# Patient Record
Sex: Female | Born: 1980 | Race: White | Hispanic: No | Marital: Married | Smoking: Current every day smoker
Health system: Southern US, Community
[De-identification: ages and names within clinical notes are randomized; demographics above are authoritative.]

## PROBLEM LIST (undated history)

## (undated) DIAGNOSIS — M545 Low back pain, unspecified: Secondary | ICD-10-CM

## (undated) DIAGNOSIS — G8929 Other chronic pain: Secondary | ICD-10-CM

## (undated) DIAGNOSIS — F419 Anxiety disorder, unspecified: Secondary | ICD-10-CM

## (undated) DIAGNOSIS — J302 Other seasonal allergic rhinitis: Secondary | ICD-10-CM

## (undated) DIAGNOSIS — K219 Gastro-esophageal reflux disease without esophagitis: Secondary | ICD-10-CM

## (undated) HISTORY — PX: NO PAST SURGERIES: SHX2092

## (undated) HISTORY — PX: TUBAL LIGATION: SHX77

---

## 2007-08-27 ENCOUNTER — Emergency Department (HOSPITAL_BASED_OUTPATIENT_CLINIC_OR_DEPARTMENT_OTHER): Admission: EM | Admit: 2007-08-27 | Discharge: 2007-08-27 | Payer: Self-pay | Admitting: Emergency Medicine

## 2008-01-08 ENCOUNTER — Emergency Department (HOSPITAL_BASED_OUTPATIENT_CLINIC_OR_DEPARTMENT_OTHER): Admission: EM | Admit: 2008-01-08 | Discharge: 2008-01-08 | Payer: Self-pay | Admitting: Emergency Medicine

## 2008-05-17 ENCOUNTER — Emergency Department (HOSPITAL_BASED_OUTPATIENT_CLINIC_OR_DEPARTMENT_OTHER): Admission: EM | Admit: 2008-05-17 | Discharge: 2008-05-17 | Payer: Self-pay | Admitting: Emergency Medicine

## 2008-06-09 ENCOUNTER — Emergency Department (HOSPITAL_BASED_OUTPATIENT_CLINIC_OR_DEPARTMENT_OTHER): Admission: EM | Admit: 2008-06-09 | Discharge: 2008-06-09 | Payer: Self-pay | Admitting: Emergency Medicine

## 2008-07-03 ENCOUNTER — Emergency Department (HOSPITAL_BASED_OUTPATIENT_CLINIC_OR_DEPARTMENT_OTHER): Admission: EM | Admit: 2008-07-03 | Discharge: 2008-07-03 | Payer: Self-pay | Admitting: Emergency Medicine

## 2008-07-31 ENCOUNTER — Emergency Department (HOSPITAL_BASED_OUTPATIENT_CLINIC_OR_DEPARTMENT_OTHER): Admission: EM | Admit: 2008-07-31 | Discharge: 2008-07-31 | Payer: Self-pay | Admitting: Emergency Medicine

## 2008-10-24 ENCOUNTER — Ambulatory Visit: Payer: Self-pay | Admitting: Interventional Radiology

## 2008-10-24 ENCOUNTER — Emergency Department (HOSPITAL_BASED_OUTPATIENT_CLINIC_OR_DEPARTMENT_OTHER): Admission: EM | Admit: 2008-10-24 | Discharge: 2008-10-24 | Payer: Self-pay | Admitting: Emergency Medicine

## 2008-12-25 ENCOUNTER — Emergency Department (HOSPITAL_BASED_OUTPATIENT_CLINIC_OR_DEPARTMENT_OTHER): Admission: EM | Admit: 2008-12-25 | Discharge: 2008-12-25 | Payer: Self-pay | Admitting: Emergency Medicine

## 2009-01-23 ENCOUNTER — Emergency Department (HOSPITAL_BASED_OUTPATIENT_CLINIC_OR_DEPARTMENT_OTHER): Admission: EM | Admit: 2009-01-23 | Discharge: 2009-01-23 | Payer: Self-pay | Admitting: Emergency Medicine

## 2009-01-25 ENCOUNTER — Emergency Department (HOSPITAL_BASED_OUTPATIENT_CLINIC_OR_DEPARTMENT_OTHER): Admission: EM | Admit: 2009-01-25 | Discharge: 2009-01-25 | Payer: Self-pay | Admitting: Emergency Medicine

## 2009-11-02 ENCOUNTER — Emergency Department (HOSPITAL_BASED_OUTPATIENT_CLINIC_OR_DEPARTMENT_OTHER): Admission: EM | Admit: 2009-11-02 | Discharge: 2009-11-02 | Payer: Self-pay | Admitting: Emergency Medicine

## 2010-02-22 ENCOUNTER — Emergency Department (HOSPITAL_BASED_OUTPATIENT_CLINIC_OR_DEPARTMENT_OTHER)
Admission: EM | Admit: 2010-02-22 | Discharge: 2010-02-22 | Payer: Self-pay | Source: Home / Self Care | Admitting: Emergency Medicine

## 2010-03-06 NOTE — L&D Delivery Note (Signed)
Pt presented to Labor and Delivery and was 8cm. She rapidly progressed to C/C/0. Second stage was brief. She had SROM with clear fluid. She delivered one live viable white female over a second degree midline tear in ROA position.  Placenta S/I 3vc. EBL-400cc Baby to NBN. No c/o. Tear closed with 3-0 chromic.

## 2010-05-01 IMAGING — CR DG LUMBAR SPINE COMPLETE 4+V
5 series · 5 of 5 positions shown · non-contrast
Comparison: None

CLINICAL DATA: lower back pain x 3 wks, no known injury

LUMBAR SPINE - COMPLETE 4+ VIEW

[t l-spine a.p.]
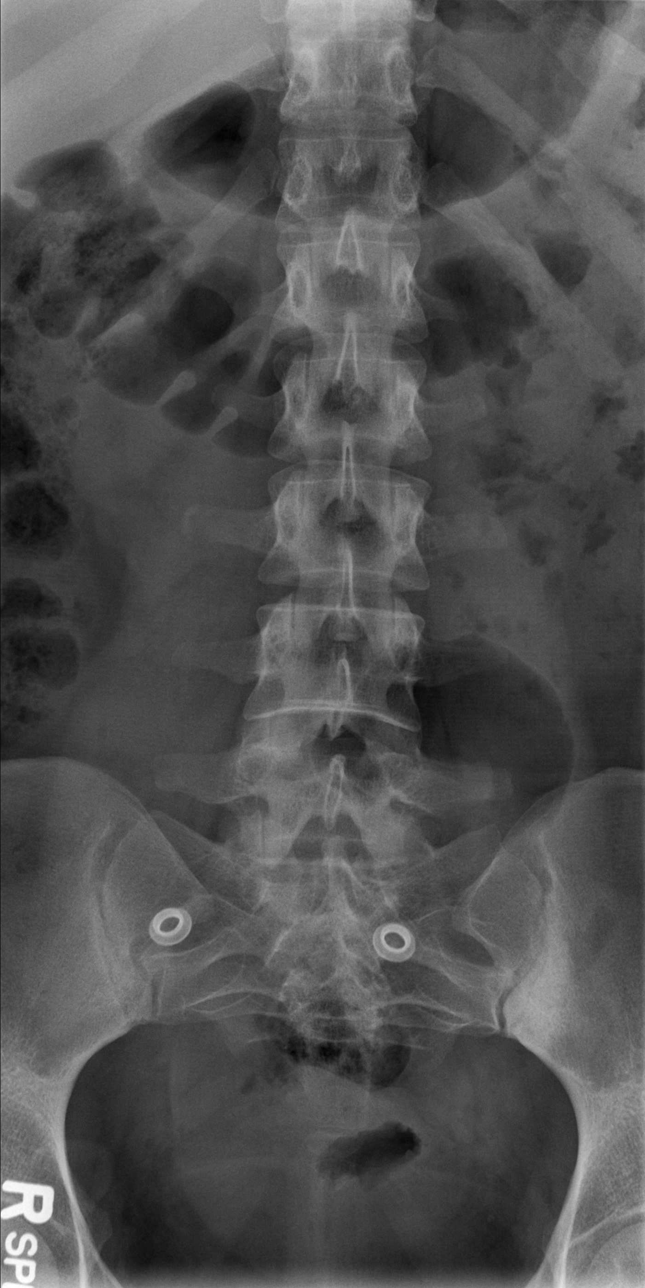

[t l-spine oblique exposure (1 of 2)]
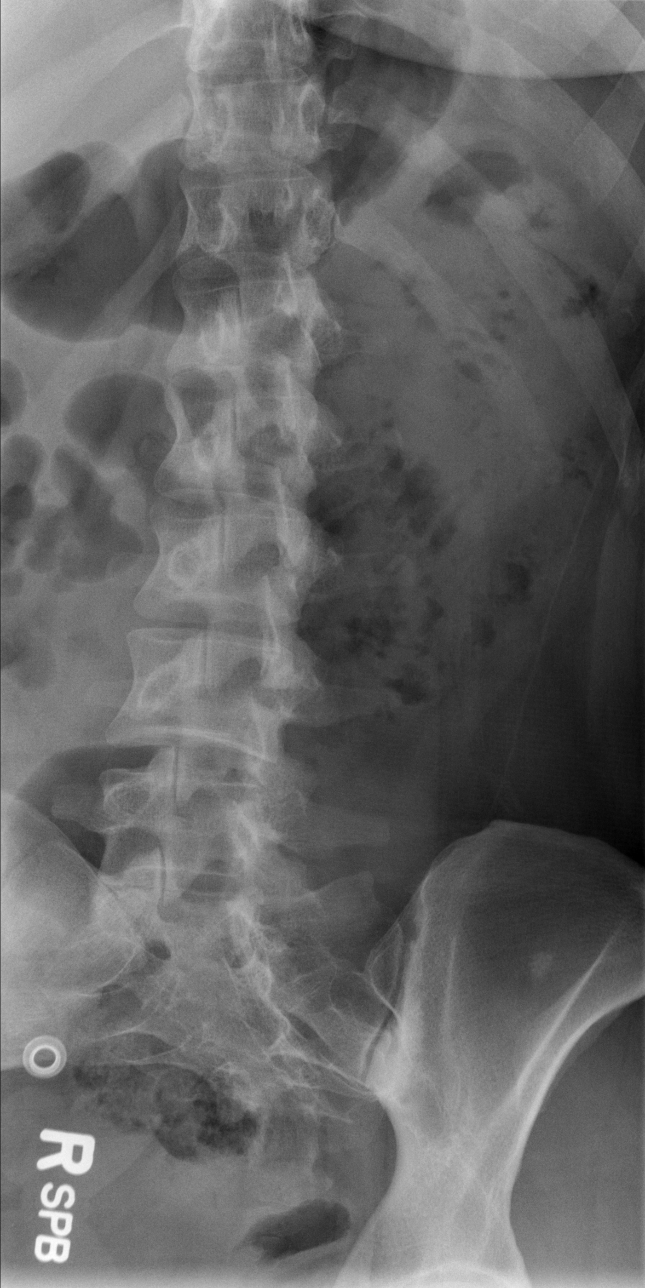

[t l-spine oblique exposure (2 of 2)]
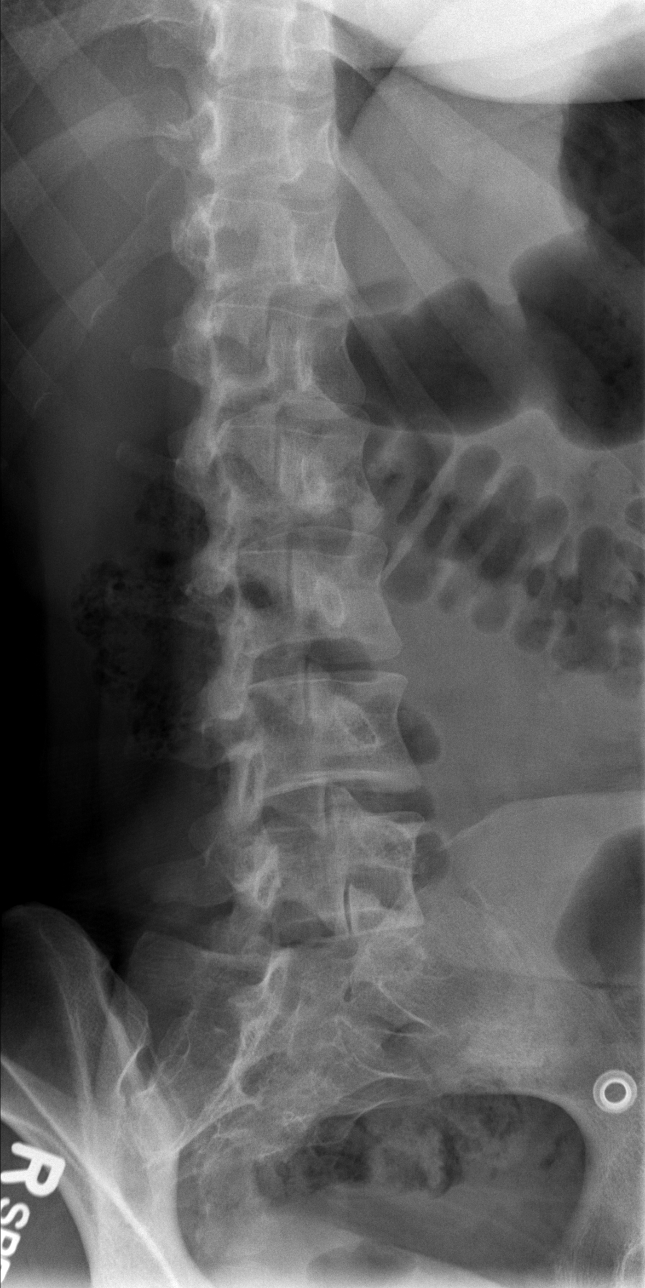

[t l-spine lat]
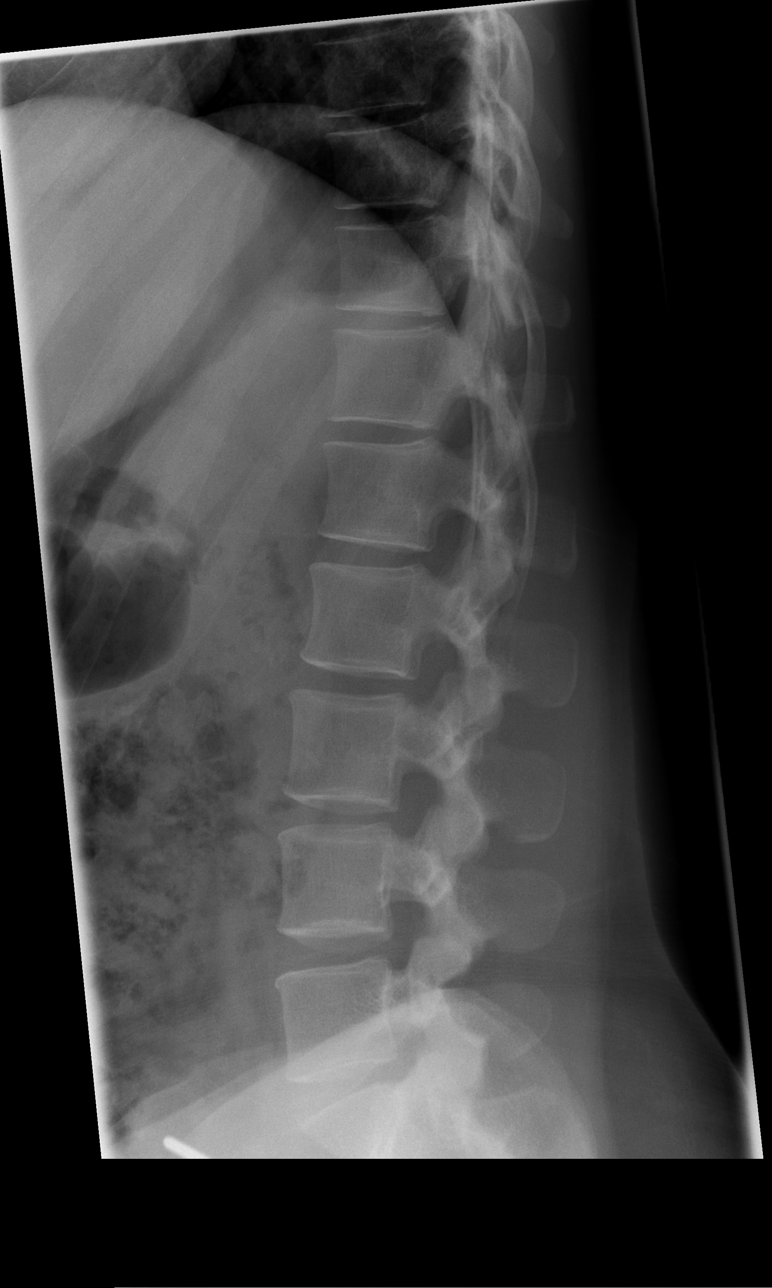

[t l-spine l5-s1 spot]
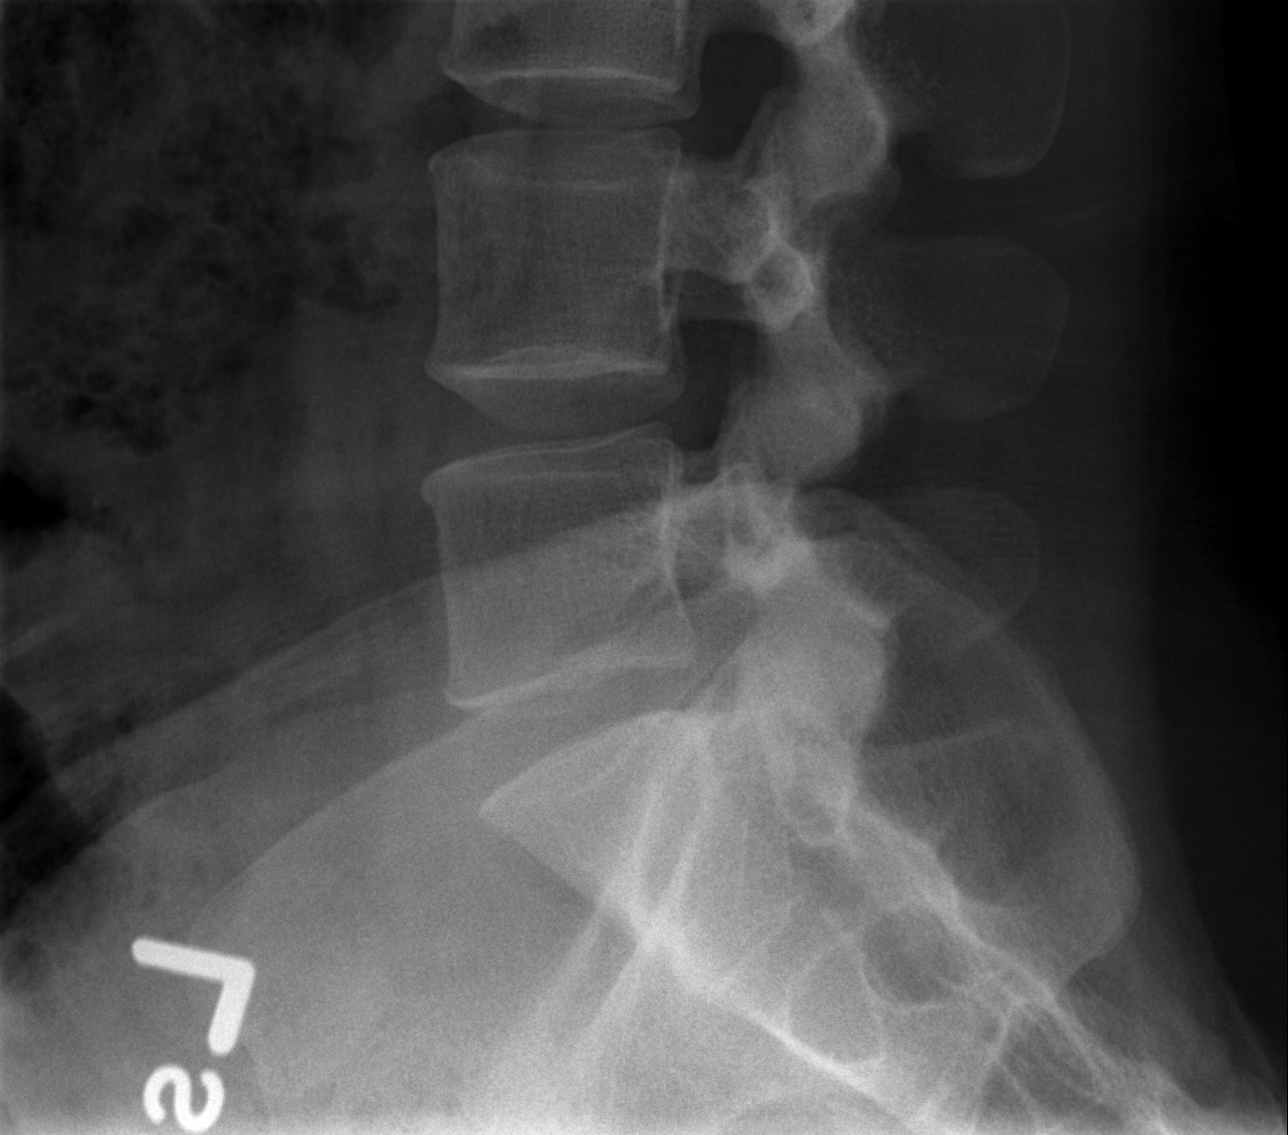

[5 of 5 positions shown; findings below may reference images not displayed]

FINDINGS: There is no evidence of lumbar spine fracture.  Alignment
is normal.  Intervertebral disc spaces are maintained.
IMPRESSION: Negative.

## 2010-05-16 LAB — WET PREP, GENITAL: Clue Cells Wet Prep HPF POC: NONE SEEN

## 2010-05-16 LAB — CBC
HCT: 40.7 % (ref 36.0–46.0)
Hemoglobin: 14.1 g/dL (ref 12.0–15.0)
MCH: 29.3 pg (ref 26.0–34.0)
MCHC: 34.6 g/dL (ref 30.0–36.0)
MCV: 84.6 fL (ref 78.0–100.0)

## 2010-05-16 LAB — DIFFERENTIAL
Basophils Relative: 1 % (ref 0–1)
Eosinophils Absolute: 0.2 10*3/uL (ref 0.0–0.7)
Monocytes Absolute: 0.6 10*3/uL (ref 0.1–1.0)
Monocytes Relative: 7 % (ref 3–12)

## 2010-05-16 LAB — URINALYSIS, ROUTINE W REFLEX MICROSCOPIC
Bilirubin Urine: NEGATIVE
Glucose, UA: NEGATIVE mg/dL
Ketones, ur: NEGATIVE mg/dL
pH: 5.5 (ref 5.0–8.0)

## 2010-05-16 LAB — GC/CHLAMYDIA PROBE AMP, GENITAL: Chlamydia, DNA Probe: NEGATIVE

## 2010-06-11 LAB — URINALYSIS, ROUTINE W REFLEX MICROSCOPIC
Bilirubin Urine: NEGATIVE
Nitrite: NEGATIVE
Specific Gravity, Urine: 1.018 (ref 1.005–1.030)
Urobilinogen, UA: 0.2 mg/dL (ref 0.0–1.0)

## 2010-06-11 LAB — PREGNANCY, URINE: Preg Test, Ur: NEGATIVE

## 2010-06-13 ENCOUNTER — Emergency Department (HOSPITAL_BASED_OUTPATIENT_CLINIC_OR_DEPARTMENT_OTHER)
Admission: EM | Admit: 2010-06-13 | Discharge: 2010-06-13 | Disposition: A | Discharge status: Home / Self Care | Payer: Self-pay | Attending: Emergency Medicine | Admitting: Emergency Medicine

## 2010-06-13 ENCOUNTER — Inpatient Hospital Stay (HOSPITAL_COMMUNITY)
Admission: AD | Admit: 2010-06-13 | Discharge: 2010-06-14 | Disposition: A | Discharge status: Home / Self Care | Payer: Self-pay | Source: Ambulatory Visit | Attending: Family Medicine | Admitting: Family Medicine

## 2010-06-13 DIAGNOSIS — R109 Unspecified abdominal pain: Secondary | ICD-10-CM | POA: Insufficient documentation

## 2010-06-13 DIAGNOSIS — O2690 Pregnancy related conditions, unspecified, unspecified trimester: Secondary | ICD-10-CM

## 2010-06-13 DIAGNOSIS — O99891 Other specified diseases and conditions complicating pregnancy: Secondary | ICD-10-CM | POA: Insufficient documentation

## 2010-06-13 LAB — URINALYSIS, ROUTINE W REFLEX MICROSCOPIC
Bilirubin Urine: NEGATIVE
Glucose, UA: NEGATIVE mg/dL
Hgb urine dipstick: NEGATIVE
Ketones, ur: NEGATIVE mg/dL
Protein, ur: NEGATIVE mg/dL

## 2010-06-13 LAB — WET PREP, GENITAL
Trich, Wet Prep: NONE SEEN
Yeast Wet Prep HPF POC: NONE SEEN

## 2010-06-13 LAB — URINE MICROSCOPIC-ADD ON

## 2010-06-14 ENCOUNTER — Inpatient Hospital Stay (HOSPITAL_COMMUNITY): Payer: Self-pay

## 2010-06-15 LAB — URINALYSIS, ROUTINE W REFLEX MICROSCOPIC
Bilirubin Urine: NEGATIVE
Glucose, UA: NEGATIVE mg/dL
Specific Gravity, Urine: 1.022 (ref 1.005–1.030)
pH: 7 (ref 5.0–8.0)

## 2010-06-15 LAB — URINE MICROSCOPIC-ADD ON

## 2010-06-15 LAB — URINE CULTURE

## 2010-06-16 ENCOUNTER — Inpatient Hospital Stay (HOSPITAL_COMMUNITY)
Admission: AD | Admit: 2010-06-16 | Discharge: 2010-06-17 | Disposition: A | Discharge status: Home / Self Care | Payer: Self-pay | Source: Ambulatory Visit | Attending: Obstetrics & Gynecology | Admitting: Obstetrics & Gynecology

## 2010-06-16 DIAGNOSIS — O99891 Other specified diseases and conditions complicating pregnancy: Secondary | ICD-10-CM | POA: Insufficient documentation

## 2010-06-16 DIAGNOSIS — O9989 Other specified diseases and conditions complicating pregnancy, childbirth and the puerperium: Secondary | ICD-10-CM

## 2010-06-16 LAB — URINALYSIS, ROUTINE W REFLEX MICROSCOPIC
Glucose, UA: NEGATIVE mg/dL
Ketones, ur: 15 mg/dL — AB
Nitrite: POSITIVE — AB
Protein, ur: 30 mg/dL — AB
Specific Gravity, Urine: 1.025 (ref 1.005–1.030)
Urobilinogen, UA: 1 mg/dL (ref 0.0–1.0)
pH: 5.5 (ref 5.0–8.0)

## 2010-06-16 LAB — URINE MICROSCOPIC-ADD ON

## 2010-06-16 LAB — URINE CULTURE: Colony Count: >=100000

## 2010-06-17 ENCOUNTER — Other Ambulatory Visit (HOSPITAL_COMMUNITY): Payer: Self-pay

## 2010-06-17 DIAGNOSIS — O26849 Uterine size-date discrepancy, unspecified trimester: Secondary | ICD-10-CM

## 2010-06-23 ENCOUNTER — Ambulatory Visit (HOSPITAL_COMMUNITY)
Admission: RE | Admit: 2010-06-23 | Discharge: 2010-06-23 | Disposition: A | Discharge status: Home / Self Care | Payer: Self-pay | Source: Ambulatory Visit

## 2010-06-23 ENCOUNTER — Encounter (HOSPITAL_COMMUNITY): Payer: Self-pay

## 2010-06-23 ENCOUNTER — Inpatient Hospital Stay (HOSPITAL_COMMUNITY)
Admission: AD | Admit: 2010-06-23 | Discharge: 2010-06-23 | Disposition: A | Discharge status: Home / Self Care | Payer: Self-pay | Source: Ambulatory Visit | Attending: Obstetrics & Gynecology | Admitting: Obstetrics & Gynecology

## 2010-06-23 DIAGNOSIS — O26849 Uterine size-date discrepancy, unspecified trimester: Secondary | ICD-10-CM

## 2010-06-23 DIAGNOSIS — Z3689 Encounter for other specified antenatal screening: Secondary | ICD-10-CM | POA: Insufficient documentation

## 2010-06-23 DIAGNOSIS — O9989 Other specified diseases and conditions complicating pregnancy, childbirth and the puerperium: Secondary | ICD-10-CM

## 2010-06-23 DIAGNOSIS — O99891 Other specified diseases and conditions complicating pregnancy: Secondary | ICD-10-CM | POA: Insufficient documentation

## 2010-07-25 LAB — HIV ANTIBODY (ROUTINE TESTING W REFLEX): HIV: NONREACTIVE

## 2010-07-25 LAB — ABO/RH

## 2010-08-06 ENCOUNTER — Emergency Department (HOSPITAL_BASED_OUTPATIENT_CLINIC_OR_DEPARTMENT_OTHER)
Admission: EM | Admit: 2010-08-06 | Discharge: 2010-08-06 | Disposition: A | Discharge status: Home / Self Care | Payer: Medicaid Other | Attending: Emergency Medicine | Admitting: Emergency Medicine

## 2010-08-06 DIAGNOSIS — K089 Disorder of teeth and supporting structures, unspecified: Secondary | ICD-10-CM | POA: Insufficient documentation

## 2010-09-21 ENCOUNTER — Inpatient Hospital Stay (HOSPITAL_COMMUNITY): Payer: Medicaid Other

## 2010-09-21 ENCOUNTER — Inpatient Hospital Stay (HOSPITAL_COMMUNITY)
Admission: AD | Admit: 2010-09-21 | Discharge: 2010-09-21 | Disposition: A | Discharge status: Home / Self Care | Payer: Medicaid Other | Source: Ambulatory Visit | Attending: Obstetrics and Gynecology | Admitting: Obstetrics and Gynecology

## 2010-09-21 ENCOUNTER — Encounter (HOSPITAL_COMMUNITY): Payer: Self-pay

## 2010-09-21 DIAGNOSIS — O99891 Other specified diseases and conditions complicating pregnancy: Secondary | ICD-10-CM | POA: Insufficient documentation

## 2010-09-21 DIAGNOSIS — O26899 Other specified pregnancy related conditions, unspecified trimester: Secondary | ICD-10-CM

## 2010-09-21 DIAGNOSIS — R109 Unspecified abdominal pain: Secondary | ICD-10-CM

## 2010-09-21 DIAGNOSIS — O9989 Other specified diseases and conditions complicating pregnancy, childbirth and the puerperium: Secondary | ICD-10-CM

## 2010-09-21 LAB — URINALYSIS, ROUTINE W REFLEX MICROSCOPIC
Bilirubin Urine: NEGATIVE
Glucose, UA: NEGATIVE mg/dL
Hgb urine dipstick: NEGATIVE
Protein, ur: NEGATIVE mg/dL

## 2010-09-21 LAB — COMPREHENSIVE METABOLIC PANEL
ALT: 14 U/L (ref 0–35)
AST: 14 U/L (ref 0–37)
Albumin: 3 g/dL — ABNORMAL LOW (ref 3.5–5.2)
Alkaline Phosphatase: 61 U/L (ref 39–117)
Chloride: 103 mEq/L (ref 96–112)
Potassium: 3.4 mEq/L — ABNORMAL LOW (ref 3.5–5.1)
Total Bilirubin: 0.2 mg/dL — ABNORMAL LOW (ref 0.3–1.2)

## 2010-09-21 LAB — CBC
HCT: 34.1 % — ABNORMAL LOW (ref 36.0–46.0)
Platelets: 218 10*3/uL (ref 150–400)
RDW: 13.4 % (ref 11.5–15.5)
WBC: 12.5 10*3/uL — ABNORMAL HIGH (ref 4.0–10.5)

## 2010-09-21 LAB — URINE MICROSCOPIC-ADD ON

## 2010-09-21 MED ORDER — ONDANSETRON 8 MG PO TBDP: 8.0000 mg | ORAL_TABLET | Freq: Three times a day (TID) | ORAL | Status: AC | PRN

## 2010-09-21 MED ORDER — ONDANSETRON 8 MG PO TBDP
8.0000 mg | ORAL_TABLET | Freq: Once | ORAL | Status: AC
  Administered 2010-09-21: 8 mg via ORAL
  Filled 2010-09-21: qty 1

## 2010-09-21 NOTE — ED Provider Notes (Addendum)
History     Chief Complaint  Patient presents with  . Abdominal Cramping   HPIpt is [redacted] weeks pregnant and presents with abdominal cramping.  Her pain is mostly fundal, but also bilateral.  She also has had nausea.  She denies constipation, diarrhea or UTI symptoms, vaginal spotting or bleeding.  She is being seen at [facility].  Her appointment was rescheduled by the clinic for another few weeks.  Pt has a history of gestational diabetes with her previous pregnancy.       Past Medical History  Diagnosis Date  . Gestational diabetes     G2=A!DM    History reviewed. No pertinent past surgical history.  Family History  Problem Relation Age of Onset  . Asthma Father   . Asthma Daughter   . Cancer Maternal Grandmother     lung  . Diabetes Paternal Grandmother     History  Substance Use Topics  . Smoking status: Current Everyday Smoker -- 0.5 packs/day for 10 years    Types: Cigarettes  . Smokeless tobacco: Not on file  . Alcohol Use: No    Allergies:  Allergies  Allergen Reactions  . Aspirin Other (See Comments)    REACTION UNKNOWN    Prescriptions prior to admission  Medication Sig Dispense Refill  . acetaminophen (TYLENOL) 500 MG tablet Take 1,000 mg by mouth every 6 (six) hours as needed. PATIENT TAKES FOR PAIN         ROS Physical Exam   Blood pressure 123/67, pulse 96, temperature 98.7 F (37.1 C), temperature source Oral, resp. rate 16, height 5\' 4"  (1.626 m), weight 158 lb 9.6 oz (71.94 kg), last menstrual period 05/10/2010, SpO2 97.00%.  Physical Exam  Constitutional: She appears well-developed and well-nourished.  HENT:  Head: Normocephalic.  Eyes: Pupils are equal, round, and reactive to light.  Neck: Neck supple.  GI: Soft. Bowel sounds are normal. She exhibits no distension. There is no rebound and no guarding.  Musculoskeletal: Normal range of motion.  Neurological: She is alert.  Skin: Skin is warm and dry.    MAU Course  Procedures   Pt's  abdomen is soft- mildly diffusely tender; at this point her pain is better.  Since pt has not had an ultrasound since 6 weeks and is due to an anatomy scan, which pt and husband say has been delayed by the clinic, will proceed with the ultrasound. U/s showed single living IUP.  [place] EGA is concordant with LMP.  No fetal anomaklies seen involving visualized anatomy. Pt felt much better after Zofran. Explained to pt that baby looked good and not reasonable explanation for pain.  Pt would need to be careful about her sugar intake with her history of GD. Pt to f/u with [facility]  Assessment and Plan  Abd pain in pregnancy Round ligament pain  Jermesha Sottile 09/21/2010, 1:55 PM

## 2010-09-21 NOTE — Progress Notes (Signed)
Lower abd cramping x1-2 weeks. Denies LOF or vaginal bleeding.

## 2010-09-22 LAB — GC/CHLAMYDIA PROBE AMP, GENITAL
Chlamydia, DNA Probe: NEGATIVE
GC Probe Amp, Genital: NEGATIVE

## 2010-10-03 ENCOUNTER — Inpatient Hospital Stay (HOSPITAL_COMMUNITY)
Admission: AD | Admit: 2010-10-03 | Discharge: 2010-10-03 | Disposition: A | Discharge status: Home / Self Care | Payer: Medicaid Other | Source: Ambulatory Visit | Attending: Obstetrics & Gynecology | Admitting: Obstetrics & Gynecology

## 2010-10-03 DIAGNOSIS — O9989 Other specified diseases and conditions complicating pregnancy, childbirth and the puerperium: Secondary | ICD-10-CM | POA: Insufficient documentation

## 2010-10-03 DIAGNOSIS — N949 Unspecified condition associated with female genital organs and menstrual cycle: Secondary | ICD-10-CM | POA: Insufficient documentation

## 2010-10-03 DIAGNOSIS — T148XXA Other injury of unspecified body region, initial encounter: Secondary | ICD-10-CM

## 2010-10-03 DIAGNOSIS — M549 Dorsalgia, unspecified: Secondary | ICD-10-CM | POA: Insufficient documentation

## 2010-10-03 DIAGNOSIS — X503XXA Overexertion from repetitive movements, initial encounter: Secondary | ICD-10-CM | POA: Insufficient documentation

## 2010-10-03 DIAGNOSIS — O99330 Smoking (tobacco) complicating pregnancy, unspecified trimester: Secondary | ICD-10-CM | POA: Insufficient documentation

## 2010-10-03 LAB — URINALYSIS, ROUTINE W REFLEX MICROSCOPIC
Bilirubin Urine: NEGATIVE
Nitrite: NEGATIVE
Specific Gravity, Urine: 1.02 (ref 1.005–1.030)
Urobilinogen, UA: 0.2 mg/dL (ref 0.0–1.0)

## 2010-10-03 MED ORDER — CYCLOBENZAPRINE HCL 10 MG PO TABS
10.0000 mg | ORAL_TABLET | Freq: Once | ORAL | Status: AC
  Administered 2010-10-03: 10 mg via ORAL
  Filled 2010-10-03: qty 1

## 2010-10-03 NOTE — Progress Notes (Signed)
DENIES MRSA.  HAS UPPER AND LOWER ABD  SOMETIMES SINCE 0300- BUT NOW BACK HURTS.

## 2010-10-03 NOTE — Progress Notes (Signed)
GAVE ICE CHIPS  

## 2010-10-03 NOTE — Progress Notes (Signed)
States she had yellowish urine yesterday, now is having back pain "killing her".  20wks, g3p2

## 2010-10-03 NOTE — ED Provider Notes (Signed)
Chief Complaint:  Back Pain   Krystal Cardenas is  30 y.o. [id number]. [redacted]w[redacted]d  Patient's last menstrual period was 05/10/2010.Marland Kitchen  Her pregnancy status is positive.  She presents complaining of Back Pain Report BL lower abd pain, described as sharp in groin area and worse with movement. Low back pain. Reports recent heavy lifting at work on Saturday. Onset is described as sudden at 0200 this morning. Prenatal care at [facility].    Obstetrical/Gynecological History: OB History    Grav Para Term Preterm Abortions TAB SAB Ect Mult Living   3 2 2  0 0 0 0 0 0 2      Past Medical History: Past Medical History  Diagnosis Date  . Gestational diabetes     G2=A!DM    Past Surgical History: No past surgical history on file.  Family History: Family History  Problem Relation Age of Onset  . Asthma Father   . Asthma Daughter   . Cancer Maternal Grandmother     lung  . Diabetes Paternal Grandmother     Social History: History  Substance Use Topics  . Smoking status: Current Everyday Smoker -- 0.5 packs/day for 10 years    Types: Cigarettes  . Smokeless tobacco: Not on file  . Alcohol Use: No    Allergies:  Allergies  Allergen Reactions  . Aspirin Other (See Comments)    REACTION UNKNOWN  . Penicillins     unknown    Prescriptions prior to admission  Medication Sig Dispense Refill  . acetaminophen (TYLENOL) 500 MG tablet Take 1,000 mg by mouth every 6 (six) hours as needed. PATIENT TAKES FOR PAIN       . ondansetron (ZOFRAN-ODT) 4 MG disintegrating tablet Take 4 mg by mouth every 8 (eight) hours as needed. nausea       . prenatal vitamin w/FE, FA (PRENATAL 1 + 1) 27-1 MG TABS Take 1 tablet by mouth daily.          Review of Systems - Negative except what has been reviewed in the HPI  Physical Exam   Blood pressure 115/63, pulse 81, temperature 98.5 F (36.9 C), temperature source Oral, resp. rate 20, height 5\' 4"  (1.626 m), weight 73.12 kg (161 lb 3.2 oz), last menstrual period  05/10/2010.  General: General appearance - alert, well appearing, and in no distress and oriented to person, place, and time Mental status - normal mood, behavior, speech, dress, motor activity, and thought processes Abdomen - soft, nontender, nondistended, no masses or organomegaly, Gravid Back exam - normal reflexes and strength bilateral lower extremities, palp spasm lumbar paraspinal muscles  Neurological - alert, oriented, normal speech, no focal findings or movement disorder noted Musculoskeletal - full range of motion without pain Focused Gynecological Exam: long/thick/close  Labs: Recent Results (from the past 24 hour(s))  URINALYSIS, ROUTINE W REFLEX MICROSCOPIC   Collection Time   10/03/10  6:40 AM      Component Value Range   Color, Urine YELLOW  YELLOW    Appearance CLEAR  CLEAR    Specific Gravity, Urine 1.020  1.005 - 1.030    pH 6.5  5.0 - 8.0    Glucose, UA NEGATIVE  NEGATIVE (mg/dL)   Hgb urine dipstick NEGATIVE  NEGATIVE    Bilirubin Urine NEGATIVE  NEGATIVE    Ketones, ur NEGATIVE  NEGATIVE (mg/dL)   Protein, ur NEGATIVE  NEGATIVE (mg/dL)   Urobilinogen, UA 0.2  0.0 - 1.0 (mg/dL)   Nitrite NEGATIVE  NEGATIVE  Leukocytes, UA NEGATIVE  NEGATIVE    I Assessment: Round Ligament Pain  Muscle Strain  Plan: Discharge Home Flexeril prn FU as scheduled for routing [facility] at [facility] E. 10/03/2010,7:13 AM

## 2010-12-01 LAB — RAPID STREP SCREEN (MED CTR MEBANE ONLY): Streptococcus, Group A Screen (Direct): POSITIVE — AB

## 2010-12-06 LAB — URINALYSIS, ROUTINE W REFLEX MICROSCOPIC
Bilirubin Urine: NEGATIVE
Ketones, ur: 15 — AB
Nitrite: NEGATIVE
pH: 7

## 2010-12-06 LAB — PREGNANCY, URINE: Preg Test, Ur: NEGATIVE

## 2010-12-06 LAB — URINE MICROSCOPIC-ADD ON

## 2011-01-05 NOTE — ED Provider Notes (Signed)
Agree with above note.  Analucia Hush 01/05/2011 12:36 PM   

## 2011-01-06 ENCOUNTER — Inpatient Hospital Stay (HOSPITAL_COMMUNITY)
Admission: AD | Admit: 2011-01-06 | Discharge: 2011-01-07 | Disposition: A | Discharge status: Home / Self Care | Payer: Medicaid Other | Source: Ambulatory Visit | Attending: Obstetrics and Gynecology | Admitting: Obstetrics and Gynecology

## 2011-01-06 ENCOUNTER — Encounter (HOSPITAL_COMMUNITY): Payer: Self-pay

## 2011-01-06 DIAGNOSIS — O479 False labor, unspecified: Secondary | ICD-10-CM

## 2011-01-06 DIAGNOSIS — O99891 Other specified diseases and conditions complicating pregnancy: Secondary | ICD-10-CM | POA: Insufficient documentation

## 2011-01-06 NOTE — Progress Notes (Signed)
Pitcher of water to pt 

## 2011-01-06 NOTE — Progress Notes (Signed)
Pt states, " I went to the bathroom to and felt something thicker than urine come out. It was white in the bottom of the tolilte. I got up and layed back down and noticed that my underware got wet. I went back to the bathroom and discovered a big glob of mucus, like a mucus plug. I 've had contractions off and on since yesterday."

## 2011-01-07 LAB — POCT FERN TEST: Fern Test: NEGATIVE

## 2011-01-07 LAB — AMNISURE RUPTURE OF MEMBRANE (ROM) NOT AT ARMC: Amnisure ROM: NEGATIVE

## 2011-01-07 NOTE — Progress Notes (Signed)
Written and verbal d/c instructions given and understanding voiced. 

## 2011-01-07 NOTE — Progress Notes (Signed)
AmniSure done and sent. Fern slide also obtained. Pt tol spec exam and sve

## 2011-01-07 NOTE — ED Provider Notes (Signed)
Krystal Tinsbloom30 y.A.[id number] @[redacted]w[redacted]d  Chief Complaint  Patient presents with  . Rupture of Membranes    SUBJECTIVE  HPI: At 2130 on 01/06/11, felt something thicker than urine come out while urinating and it looked white in toilet bowl. After laying down she had wet underwear wet and passed mucusy white discharge with streak of pink blood. Not leaking since. Has felt occ mild UC. No vaginal bleeding. Good fetal movement.    Past Medical History  Diagnosis Date  . Gestational diabetes     G2=A!DM   Past Surgical History  Procedure Date  . No past surgeries     No current facility-administered medications on file prior to encounter.   Current Outpatient Prescriptions on File Prior to Encounter  Medication Sig Dispense Refill  . prenatal vitamin w/FE, FA (PRENATAL 1 + 1) 27-1 MG TABS Take 1 tablet by mouth daily.         Allergies  Allergen Reactions  . Aspirin Other (See Comments)    Childhood allergy; Reaction unknown  . Penicillins Itching and Other (See Comments)    Secondary reaction unknown    ROS: Pertinent items in HPI  OBJECTIVE  BP 117/77  Pulse 85  Temp(Src) 98.9 F (37.2 C) (Oral)  Resp 18  Ht 5\' 4"  (1.626 m)  Wt 75.921 kg (167 lb 6 oz)  BMI 28.73 kg/m2  LMP 05/10/2010   Physical Exam  Constitutional: She is oriented to person, place, and time and well-developed, well-nourished, and in no distress. No distress.  HENT:  Head: Normocephalic.  Eyes: Pupils are equal, round, and reactive to light.  Neck: Neck supple.  Abdominal: Soft. There is no tenderness.       S=D  Genitourinary: Uterus normal. Vaginal discharge found.       SSE: white discharge, no blood. Neg pool. Neg fern VE: post soft/ext 1/int closed/ high  Neurological: She is alert and oriented to person, place, and time.  Skin: Skin is warm and dry.  Psychiatric: Affect normal.   FHR:130 baseline reactive, mod variability Toco: slight UI  Results for orders placed during the hospital  encounter of 01/06/11 (from the past 24 hour(s))  AMNISURE RUPTURE OF MEMBRANE (ROM)     Status: Normal   Collection Time   01/07/11 12:20 AM      Component Value Range   Amnisure ROM NEGATIVE    POCT FERN TEST     Status: Normal   Collection Time   01/07/11 12:27 AM      Component Value Range   Fern Test Negative     ASSESSMENT   G3P2002 at 34 wks with Cat 1 FHR tracing No evidence SROM  PLAN  D/W Dr. Henderson Cloud. Discharge home with labor precautions

## 2011-01-07 NOTE — Progress Notes (Signed)
Ok to d/c efm per D. Poe CNM 

## 2011-01-15 ENCOUNTER — Inpatient Hospital Stay (HOSPITAL_COMMUNITY)
Admission: AD | Admit: 2011-01-15 | Discharge: 2011-01-16 | Disposition: A | Discharge status: Home / Self Care | Payer: Medicaid Other | Source: Ambulatory Visit | Attending: Obstetrics and Gynecology | Admitting: Obstetrics and Gynecology

## 2011-01-15 DIAGNOSIS — O4700 False labor before 37 completed weeks of gestation, unspecified trimester: Secondary | ICD-10-CM | POA: Insufficient documentation

## 2011-01-15 LAB — URINALYSIS, ROUTINE W REFLEX MICROSCOPIC
Ketones, ur: NEGATIVE mg/dL
Nitrite: NEGATIVE
Protein, ur: NEGATIVE mg/dL
Urobilinogen, UA: 0.2 mg/dL (ref 0.0–1.0)
pH: 6 (ref 5.0–8.0)

## 2011-01-15 LAB — URINE MICROSCOPIC-ADD ON

## 2011-01-15 NOTE — ED Provider Notes (Signed)
Chief Complaint:  Contractions   Krystal Cardenas is  30 y.o. [id number].  Patient's last menstrual period was 05/10/2010..  [redacted]w[redacted]d She presents complaining of Contractions . Onset is described as gradual and has been present for  2 hours. Denies blding, LOF, Reports + FM  Obstetrical/Gynecological History: OB History    Grav Para Term Preterm Abortions TAB SAB Ect Mult Living   3 2 2  0 0 0 0 0 0 2      Past Medical History: Past Medical History  Diagnosis Date  . Gestational diabetes     G2=A!DM    Past Surgical History: Past Surgical History  Procedure Date  . No past surgeries     Family History: Family History  Problem Relation Age of Onset  . Asthma Father   . Asthma Daughter   . Cancer Maternal Grandmother     lung  . Diabetes Paternal Grandmother     Social History: History  Substance Use Topics  . Smoking status: Current Everyday Smoker -- 0.5 packs/day for 10 years    Types: Cigarettes  . Smokeless tobacco: Not on file  . Alcohol Use: No    Allergies:  Allergies  Allergen Reactions  . Aspirin Other (See Comments)    Childhood allergy; Reaction unknown  . Penicillins Itching and Other (See Comments)    Secondary reaction unknown    Prescriptions prior to admission  Medication Sig Dispense Refill  . diphenhydramine-acetaminophen (TYLENOL PM) 25-500 MG TABS Take 1 tablet by mouth at bedtime as needed. For pain       . OVER THE COUNTER MEDICATION Take 1 tablet by mouth daily as needed. [employer] brand acid reducer for heartburn       . prenatal vitamin w/FE, FA (PRENATAL 1 + 1) 27-1 MG TABS Take 1 tablet by mouth daily.          Review of Systems - Negative except what has been reviewed in the HPI  Physical Exam   Blood pressure 127/80, pulse 84, temperature 98 F (36.7 C), temperature source Oral, resp. rate 20, last menstrual period 05/10/2010.  General: General appearance - alert, well appearing, and in no distress and oriented to person,  place, and time Mental status - alert, oriented to person, place, and time, normal mood, behavior, speech, dress, motor activity, and thought processes, affect appropriate to mood Abdomen - Gravid, nontender Focused Gynecological Exam: CERVIX: FT/Thick/High FHT: 120, category 1 tracing, irreg ctx, mild to palp  Labs: Recent Results (from the past 24 hour(s))  URINALYSIS, ROUTINE W REFLEX MICROSCOPIC   Collection Time   01/15/11 11:20 PM      Component Value Range   Color, Urine YELLOW  YELLOW    Appearance CLEAR  CLEAR    Specific Gravity, Urine 1.015  1.005 - 1.030    pH 6.0  5.0 - 8.0    Glucose, UA NEGATIVE  NEGATIVE (mg/dL)   Hgb urine dipstick NEGATIVE  NEGATIVE    Bilirubin Urine NEGATIVE  NEGATIVE    Ketones, ur NEGATIVE  NEGATIVE (mg/dL)   Protein, ur NEGATIVE  NEGATIVE (mg/dL)   Urobilinogen, UA 0.2  0.0 - 1.0 (mg/dL)   Nitrite NEGATIVE  NEGATIVE    Leukocytes, UA SMALL (*) NEGATIVE   URINE MICROSCOPIC-ADD ON   Collection Time   01/15/11 11:20 PM      Component Value Range   Squamous Epithelial / LPF MANY (*) RARE    WBC, UA 7-10  <3 (WBC/hpf)   Bacteria, UA RARE  RARE    Urine-Other MUCOUS PRESENT     MD Consult: Discussed with Dr. Claiborne Billings   Assessment: False Labor Fetal Testing c/w well-being  Plan: Discharge home FU in office tomorrow for routine visit  Fareed Fung E. 01/15/2011,11:53 PM

## 2011-01-16 ENCOUNTER — Inpatient Hospital Stay (HOSPITAL_COMMUNITY)
Admission: AD | Admit: 2011-01-16 | Discharge: 2011-01-16 | Disposition: A | Discharge status: Home / Self Care | Payer: Medicaid Other | Source: Ambulatory Visit | Attending: Obstetrics and Gynecology | Admitting: Obstetrics and Gynecology

## 2011-01-16 ENCOUNTER — Encounter (HOSPITAL_COMMUNITY): Payer: Self-pay | Admitting: *Deleted

## 2011-01-16 DIAGNOSIS — O4700 False labor before 37 completed weeks of gestation, unspecified trimester: Secondary | ICD-10-CM | POA: Insufficient documentation

## 2011-01-16 DIAGNOSIS — Y92410 Unspecified street and highway as the place of occurrence of the external cause: Secondary | ICD-10-CM | POA: Insufficient documentation

## 2011-01-16 LAB — URINALYSIS, ROUTINE W REFLEX MICROSCOPIC
Bilirubin Urine: NEGATIVE
Glucose, UA: NEGATIVE mg/dL
Hgb urine dipstick: NEGATIVE
Ketones, ur: 15 mg/dL — AB
Nitrite: NEGATIVE
Specific Gravity, Urine: 1.02 (ref 1.005–1.030)
pH: 6 (ref 5.0–8.0)

## 2011-01-16 LAB — URINE MICROSCOPIC-ADD ON
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
## 2011-01-16 NOTE — ED Provider Notes (Signed)
See Attending Progress Note
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
## 2011-01-16 NOTE — Progress Notes (Signed)
Pt relaxed with no signs of discomfort-just finished eating and PO fluids encouraged

## 2011-01-19 LAB — STREP B DNA PROBE: GBS: NEGATIVE

## 2011-01-23 ENCOUNTER — Inpatient Hospital Stay (HOSPITAL_COMMUNITY)
Admission: AD | Admit: 2011-01-23 | Discharge: 2011-01-23 | Disposition: A | Discharge status: Home / Self Care | Payer: Medicaid Other | Source: Ambulatory Visit | Attending: Obstetrics & Gynecology | Admitting: Obstetrics & Gynecology

## 2011-01-23 ENCOUNTER — Encounter (HOSPITAL_COMMUNITY): Payer: Self-pay | Admitting: *Deleted

## 2011-01-23 DIAGNOSIS — O26899 Other specified pregnancy related conditions, unspecified trimester: Secondary | ICD-10-CM

## 2011-01-23 DIAGNOSIS — R109 Unspecified abdominal pain: Secondary | ICD-10-CM | POA: Insufficient documentation

## 2011-01-23 DIAGNOSIS — O99891 Other specified diseases and conditions complicating pregnancy: Secondary | ICD-10-CM | POA: Insufficient documentation

## 2011-01-23 DIAGNOSIS — R350 Frequency of micturition: Secondary | ICD-10-CM

## 2011-01-23 DIAGNOSIS — M549 Dorsalgia, unspecified: Secondary | ICD-10-CM

## 2011-01-23 DIAGNOSIS — M545 Low back pain, unspecified: Secondary | ICD-10-CM | POA: Insufficient documentation

## 2011-01-23 LAB — URINE MICROSCOPIC-ADD ON

## 2011-01-23 LAB — URINALYSIS, ROUTINE W REFLEX MICROSCOPIC
Bilirubin Urine: NEGATIVE
Nitrite: NEGATIVE
Specific Gravity, Urine: 1.01 (ref 1.005–1.030)
Urobilinogen, UA: 0.2 mg/dL (ref 0.0–1.0)

## 2011-01-23 MED ORDER — ZOLPIDEM TARTRATE 10 MG PO TABS: 10.0000 mg | ORAL_TABLET | Freq: Every evening | ORAL | Status: DC | PRN

## 2011-01-23 NOTE — Progress Notes (Signed)
Pt G3 P2 at 36.2wks having lower abd pressure, low back pain and contractions.  Frequency with urination, only voiding a small amt at a time.

## 2011-01-23 NOTE — ED Provider Notes (Signed)
Krystal Cardenas is a 30 y.o. year old G95P2002 female at [redacted]w[redacted]d weeks gestation who presents to [facility] reporting pain in low abd, low back and down inner thighs that has worsened over the past few months to the point that she cannot sleep > 45 minutes at a time. She also reports frequency and urgency, but states it does not feel like a UTI. She reports pos FM and braxton Hicks UCs and denies LOF or VB.  Maternal Medical History:  Reason for admission: Reason for Admission:   nausea  OB History    Grav Para Term Preterm Abortions TAB SAB Ect Mult Living   3 2 2  0 0 0 0 0 0 2     Past Medical History  Diagnosis Date  .    Frequent UTIs  Past Surgical History  Procedure Date  . No past surgeries    Family History: family history includes Asthma in her daughter and father; Cancer in her maternal grandmother; and Diabetes in her paternal grandmother. Social History:  reports that she quit smoking about 7 months ago. Her smoking use included Cigarettes. She has a 5 pack-year smoking history. She has never used smokeless tobacco. She reports that she does not drink alcohol or use illicit drugs.  Review of Systems  Constitutional: Negative for fever and chills.  Cardiovascular: Negative for leg swelling.  Gastrointestinal: Positive for abdominal pain (bilat low abd). Negative for nausea, vomiting, diarrhea and constipation.  Genitourinary: Positive for urgency and frequency. Negative for dysuria, hematuria and flank pain.  Musculoskeletal: Positive for back pain (bilat low back pain).       Inner thigh pain      Blood pressure 121/74, pulse 83, temperature 98.5 F (36.9 C), temperature source Oral, resp. rate 18, height 5\' 4"  (1.626 m), weight 77.384 kg (170 lb 9.6 oz), last menstrual period 05/10/2010. Maternal Exam:  Uterine Assessment: Contraction strength is mild.  Contraction frequency is rare.   Abdomen: Fundal height is S=D.   Fetal presentation: vertex  Introitus: Normal vulva.  Pelvis: adequate for delivery.   Cervix: Cervix evaluated by digital exam.     Fetal Exam Fetal Monitor Review: Mode: ultrasound.   Baseline rate: 120.  Variability: moderate (6-25 bpm).   Pattern: accelerations present and no decelerations.    Fetal State Assessment: Category I - tracings are normal.     Physical Exam  Nursing note and vitals reviewed. Constitutional: She is oriented to person, place, and time. She appears well-developed and well-nourished. No distress.  Cardiovascular: Normal rate.   Respiratory: Effort normal.  GI: Soft. There is no tenderness. There is no CVA tenderness.  Genitourinary: Vagina normal.  Musculoskeletal: Normal range of motion.  Neurological: She is alert and oriented to person, place, and time.  Skin: Skin is warm and dry.  Psychiatric: She has a normal mood and affect.    Cervix: 2/long, vertex per RN  Results for orders placed during the hospital encounter of 01/23/11 (from the past 24 hour(s))  URINALYSIS, ROUTINE W REFLEX MICROSCOPIC     Status: Abnormal   Collection Time   01/23/11  2:58 AM      Component Value Range   Color, Urine YELLOW  YELLOW    Appearance HAZY (*) CLEAR    Specific Gravity, Urine 1.010  1.005 - 1.030    pH 7.0  5.0 - 8.0    Glucose, UA NEGATIVE  NEGATIVE (mg/dL)   Hgb urine dipstick NEGATIVE  NEGATIVE    Bilirubin Urine  NEGATIVE  NEGATIVE    Ketones, ur NEGATIVE  NEGATIVE (mg/dL)   Protein, ur NEGATIVE  NEGATIVE (mg/dL)   Urobilinogen, UA 0.2  0.0 - 1.0 (mg/dL)   Nitrite NEGATIVE  NEGATIVE    Leukocytes, UA SMALL (*) NEGATIVE   URINE MICROSCOPIC-ADD ON     Status: Abnormal   Collection Time   01/23/11  2:58 AM      Component Value Range   Squamous Epithelial / LPF FEW (*) RARE    WBC, UA 3-6  <3 (WBC/hpf)   Bacteria, UA FEW (*) RARE    Prenatal labs: ABO, Rh:   Antibody:   Rubella:   RPR:    HBsAg:    HIV:    GBS:     Assessment/Plan: Assessment: 1. Discomforts of pregnancy 2. FHR category  I 3. Possible UTI   Plan: 1. D/C home per consult w/ Dr. Arlyce Dice 2. Urine Culture 3. RX Ambien 4. Comfort measures 5. Labor precautions, FKCs  Larae Caison 01/23/2011, 5:01 AM

## 2011-01-24 ENCOUNTER — Inpatient Hospital Stay (HOSPITAL_COMMUNITY)
Admission: AD | Admit: 2011-01-24 | Discharge: 2011-01-25 | Disposition: A | Discharge status: Home / Self Care | Payer: Medicaid Other | Source: Ambulatory Visit | Attending: Obstetrics and Gynecology | Admitting: Obstetrics and Gynecology

## 2011-01-24 DIAGNOSIS — O99891 Other specified diseases and conditions complicating pregnancy: Secondary | ICD-10-CM | POA: Insufficient documentation

## 2011-01-24 DIAGNOSIS — R109 Unspecified abdominal pain: Secondary | ICD-10-CM | POA: Insufficient documentation

## 2011-01-24 LAB — URINE CULTURE: Culture  Setup Time: 201211191051

## 2011-01-25 ENCOUNTER — Encounter (HOSPITAL_COMMUNITY): Payer: Self-pay | Admitting: *Deleted

## 2011-01-25 NOTE — Progress Notes (Signed)
Dr. Henderson Cloud notified of pt presenting for labor check.  Notified of VE and ctx pattern with reactive fetal strip.  Orders received to monitor for one hour and recheck cervix.  If no cervical change may dc home.

## 2011-01-25 NOTE — Progress Notes (Signed)
PT SAYS SHE WAS HERE YESTERDAY  FOR UC-  VE 2 CM.   HAS BEEN UC ALL DAY-  WORSE- CALLED DR HORVATH- TOLD TO COME IN.

## 2011-01-28 NOTE — ED Provider Notes (Signed)
This patient was seen, diagnosed, and treated by the EDP.  Hospital policy requires me to sign off on this encounter.

## 2011-01-30 ENCOUNTER — Inpatient Hospital Stay (HOSPITAL_COMMUNITY)
Admission: AD | Admit: 2011-01-30 | Discharge: 2011-01-31 | Disposition: A | Discharge status: Home / Self Care | Payer: Medicaid Other | Source: Ambulatory Visit | Attending: Obstetrics and Gynecology | Admitting: Obstetrics and Gynecology

## 2011-01-30 DIAGNOSIS — O479 False labor, unspecified: Secondary | ICD-10-CM | POA: Insufficient documentation

## 2011-01-30 NOTE — Progress Notes (Signed)
Pt G3 P2 at 37.6wks having cramping and lower back pain.  Denies bleeding or leaking fluid.  No problems with pregnancy.

## 2011-01-31 ENCOUNTER — Encounter (HOSPITAL_COMMUNITY): Payer: Self-pay | Admitting: *Deleted

## 2011-02-03 ENCOUNTER — Inpatient Hospital Stay (HOSPITAL_COMMUNITY)
Admission: AD | Admit: 2011-02-03 | Discharge: 2011-02-05 | DRG: 775 | Disposition: A | Discharge status: Home / Self Care | Payer: Medicaid Other | Source: Ambulatory Visit | Attending: Obstetrics and Gynecology | Admitting: Obstetrics and Gynecology

## 2011-02-03 ENCOUNTER — Encounter (HOSPITAL_COMMUNITY): Payer: Self-pay | Admitting: *Deleted

## 2011-02-03 LAB — CBC
HCT: 35.5 % — ABNORMAL LOW (ref 36.0–46.0)
Hemoglobin: 12.3 g/dL (ref 12.0–15.0)
MCHC: 34.6 g/dL (ref 30.0–36.0)
WBC: 21.3 10*3/uL — ABNORMAL HIGH (ref 4.0–10.5)

## 2011-02-03 LAB — ABO/RH: ABO/RH(D): O POS

## 2011-02-03 MED ORDER — TETANUS-DIPHTH-ACELL PERTUSSIS 5-2.5-18.5 LF-MCG/0.5 IM SUSP
0.5000 mL | Freq: Once | INTRAMUSCULAR | Status: AC
  Administered 2011-02-04: 0.5 mL via INTRAMUSCULAR
  Filled 2011-02-03: qty 0.5

## 2011-02-03 MED ORDER — IBUPROFEN 600 MG PO TABS: 600.0000 mg | ORAL_TABLET | Freq: Four times a day (QID) | ORAL | Status: DC

## 2011-02-03 MED ORDER — MEASLES, MUMPS & RUBELLA VAC ~~LOC~~ INJ: 0.5000 mL | INJECTION | Freq: Once | SUBCUTANEOUS | Status: DC

## 2011-02-03 MED ORDER — OXYCODONE-ACETAMINOPHEN 5-325 MG PO TABS: 2.0000 | ORAL_TABLET | ORAL | Status: DC | PRN

## 2011-02-03 MED ORDER — ONDANSETRON HCL 4 MG PO TABS: 4.0000 mg | ORAL_TABLET | ORAL | Status: DC | PRN

## 2011-02-03 MED ORDER — LACTATED RINGERS IV SOLN: INTRAVENOUS | Status: DC

## 2011-02-03 MED ORDER — OXYCODONE-ACETAMINOPHEN 5-325 MG PO TABS
1.0000 | ORAL_TABLET | ORAL | Status: DC | PRN
  Administered 2011-02-04: 1 via ORAL
  Administered 2011-02-04: 2 via ORAL
  Administered 2011-02-04 (×3): 1 via ORAL
  Filled 2011-02-03 (×2): qty 1
  Filled 2011-02-03: qty 2
  Filled 2011-02-03 (×2): qty 1

## 2011-02-03 MED ORDER — WITCH HAZEL-GLYCERIN EX PADS: 1.0000 "application " | MEDICATED_PAD | CUTANEOUS | Status: DC | PRN

## 2011-02-03 MED ORDER — BENZOCAINE-MENTHOL 20-0.5 % EX AERO
1.0000 "application " | INHALATION_SPRAY | CUTANEOUS | Status: DC | PRN
  Administered 2011-02-04: 1 via TOPICAL

## 2011-02-03 MED ORDER — OXYTOCIN 20 UNITS IN LACTATED RINGERS INFUSION - SIMPLE
125.0000 mL/h | Freq: Once | INTRAVENOUS | Status: AC
  Administered 2011-02-03: 125 mL/h via INTRAVENOUS

## 2011-02-03 MED ORDER — ONDANSETRON HCL 4 MG/2ML IJ SOLN: 4.0000 mg | INTRAMUSCULAR | Status: DC | PRN

## 2011-02-03 MED ORDER — IBUPROFEN 600 MG PO TABS: 600.0000 mg | ORAL_TABLET | Freq: Four times a day (QID) | ORAL | Status: DC | PRN

## 2011-02-03 MED ORDER — LIDOCAINE HCL (PF) 1 % IJ SOLN
30.0000 mL | INTRAMUSCULAR | Status: DC | PRN
  Filled 2011-02-03: qty 30

## 2011-02-03 MED ORDER — DIBUCAINE 1 % RE OINT: 1.0000 "application " | TOPICAL_OINTMENT | RECTAL | Status: DC | PRN

## 2011-02-03 MED ORDER — ACETAMINOPHEN 325 MG PO TABS: 650.0000 mg | ORAL_TABLET | ORAL | Status: DC | PRN

## 2011-02-03 MED ORDER — LACTATED RINGERS IV SOLN
500.0000 mL | INTRAVENOUS | Status: DC | PRN
Start: 2011-02-03 — End: 2011-02-03

## 2011-02-03 MED ORDER — ZOLPIDEM TARTRATE 5 MG PO TABS: 5.0000 mg | ORAL_TABLET | Freq: Every evening | ORAL | Status: DC | PRN

## 2011-02-03 MED ORDER — CITRIC ACID-SODIUM CITRATE 334-500 MG/5ML PO SOLN: 30.0000 mL | ORAL | Status: DC | PRN

## 2011-02-03 MED ORDER — SIMETHICONE 80 MG PO CHEW: 80.0000 mg | CHEWABLE_TABLET | ORAL | Status: DC | PRN

## 2011-02-03 MED ORDER — OXYTOCIN BOLUS FROM INFUSION
500.0000 mL | Freq: Once | INTRAVENOUS | Status: DC
  Filled 2011-02-03: qty 500
  Filled 2011-02-03: qty 1000

## 2011-02-03 NOTE — Progress Notes (Signed)
Pt up and ambulates to br, voids, pericare done.  Pt ambulates around room.  Waiting on mother/baby to transfer.

## 2011-02-03 NOTE — H&P (Signed)
Pt is a 30 year old white female G3P2002 at term who presents to the hospital in active labor. On admission patient was 5 cm. PNC was uncomplicated.  PMHx- see hollister  PE- VVSAF         HEENT- wnl         Abd- gravid, palp contractions.         FHTs- reactive  IMP/ IUP at term, labor Plan/ admit

## 2011-02-03 NOTE — Progress Notes (Signed)
Pt states, " I went to the office yesterday, and I was 3.5 -4 cm by Dr. Henderson Cloud. After I left I started having contractions but only every 20 min. By this morning they were 9 minutes and now they are 3-5 min. And she hasn't been moving as much."

## 2011-02-04 LAB — CBC
MCV: 90.3 fL (ref 78.0–100.0)
Platelets: 161 10*3/uL (ref 150–400)
RBC: 3.2 MIL/uL — ABNORMAL LOW (ref 3.87–5.11)
RDW: 14.7 % (ref 11.5–15.5)
WBC: 20.7 10*3/uL — ABNORMAL HIGH (ref 4.0–10.5)

## 2011-02-04 MED ORDER — BENZOCAINE-MENTHOL 20-0.5 % EX AERO
INHALATION_SPRAY | CUTANEOUS | Status: AC
  Filled 2011-02-04: qty 56

## 2011-02-04 NOTE — Progress Notes (Signed)
Patient is eating, ambulating, voiding.  Pain control is good.  Filed Vitals:   02/03/11 2100 02/03/11 2200 02/04/11 0158 02/04/11 0645  BP: 137/84 123/81 134/80 119/71  Pulse: 85 99 97 89  Temp: 98.2 F (36.8 C) 98.2 F (36.8 C) 98.1 F (36.7 C) 98 F (36.7 C)  TempSrc: Oral Oral Oral Oral  Resp: 20 20 20 20   Height:      Weight:      SpO2: 98% 97% 98% 98%    Fundus firm Perineum without swelling.  Lab Results  Component Value Date   WBC 20.7* 02/04/2011   HGB 9.9* 02/04/2011   HCT 28.9* 02/04/2011   MCV 90.3 02/04/2011   PLT 161 02/04/2011    --/--/O POS (11/30 1729)/RI  A/P Post partum day 1.  Routine care.  Expect d/c per plan tomorrow.    Gwynevere Lizana A

## 2011-02-05 MED ORDER — OXYCODONE-ACETAMINOPHEN 5-325 MG PO TABS: 1.0000 | ORAL_TABLET | ORAL | Status: AC | PRN

## 2011-02-05 NOTE — Progress Notes (Signed)
Patient is eating, ambulating, voiding.  Pain control is good.  Filed Vitals:   02/04/11 0645 02/04/11 1515 02/04/11 2045 02/05/11 0600  BP: 119/71 119/63 120/75 105/67  Pulse: 89 90 77 74  Temp: 98 F (36.7 C) 98.4 F (36.9 C) 97.9 F (36.6 C) 98 F (36.7 C)  TempSrc: Oral Oral Oral Oral  Resp: 20 20 18 18   Height:      Weight:      SpO2: 98%       Fundus firm Perineum without swelling.  Lab Results  Component Value Date   WBC 20.7* 02/04/2011   HGB 9.9* 02/04/2011   HCT 28.9* 02/04/2011   MCV 90.3 02/04/2011   PLT 161 02/04/2011    --/--/O POS (11/30 1729)/RI  A/P Post partum day 2.  Routine care.  Expect d/c today.    Krystal Cardenas A

## 2011-02-05 NOTE — Discharge Summary (Addendum)
Obstetric Discharge Summary Reason for Admission: onset of labor Prenatal Procedures: none Intrapartum Procedures: spontaneous vaginal delivery Postpartum Procedures: none Complications-Operative and Postpartum: 2 degree perineal laceration Hemoglobin  Date Value Range Status  02/04/2011 9.9* 12.0-15.0 (g/dL) Final     DELTA CHECK NOTED     REPEATED TO VERIFY     HCT  Date Value Range Status  02/04/2011 28.9* 36.0-46.0 (%) Final    Discharge Diagnoses: Term Pregnancy-delivered  Discharge Information: Date: 02/05/2011 Activity: pelvic rest Diet: routine Medications:percocet for pain. Condition: stable Instructions: refer to practice specific booklet Discharge to: home Follow-up Information    Follow up with ANDERSON,MARK E. Make an appointment in 4 weeks.   Contact information:   [address] [place] [address] [phone]          Newborn Data: Live born female  Birth Weight: 6 lb 10.4 oz (3015 g) APGAR: 9, 9  Home with mother.  Krystal Cardenas A 02/05/2011, 9:27 AM

## 2011-02-06 NOTE — Progress Notes (Signed)
UR chart review completed.  

## 2011-12-29 IMAGING — US US OB TRANSVAGINAL
1 series · 13 of 22 positions shown · non-contrast
Comparison: none

[Series 1: us ob transvaginal · 13 of 22 slices shown]
[im 1/22]
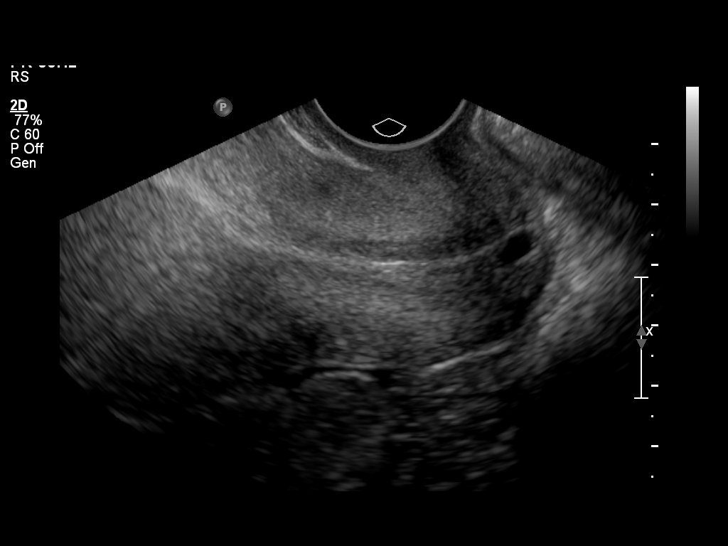
[im 3/22]
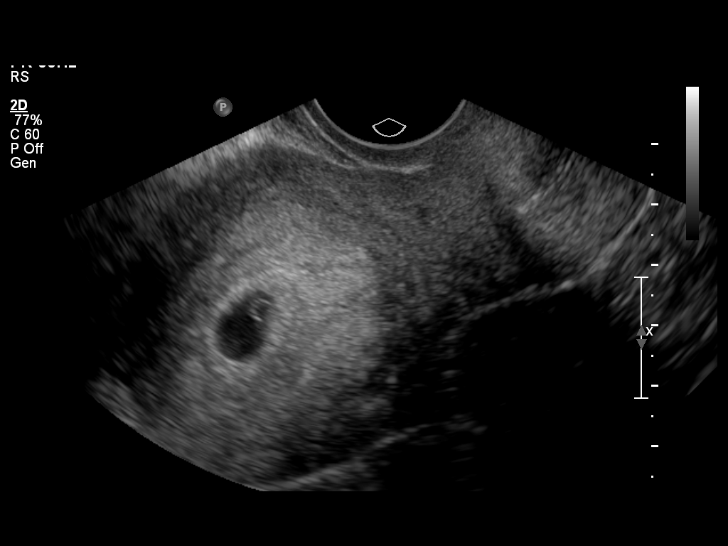
[im 5/22]
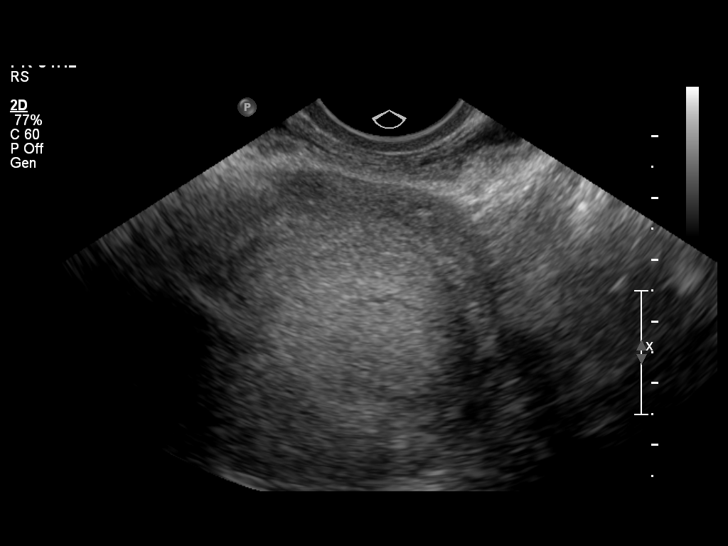
[im 6/22]
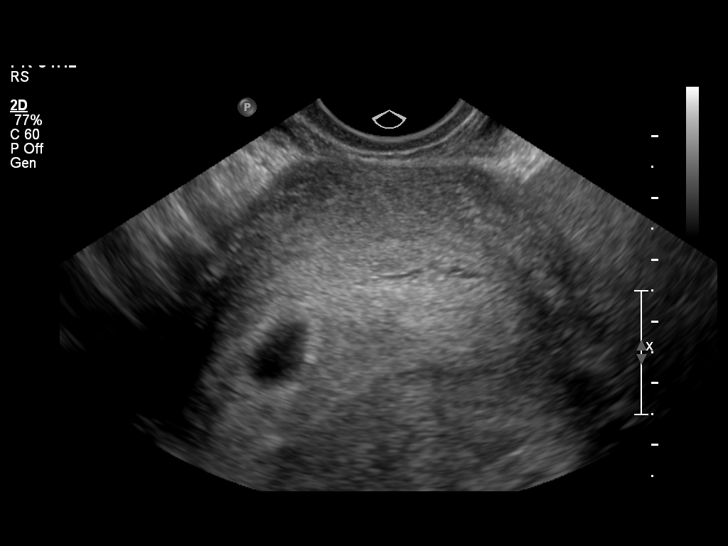
[im 8/22]
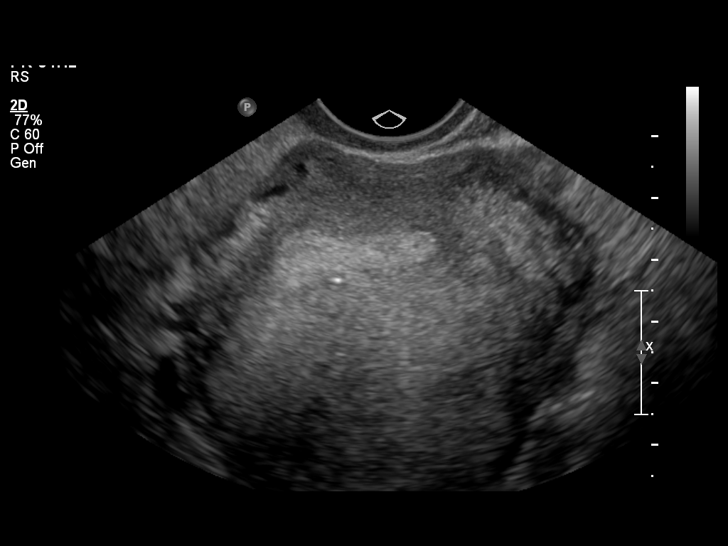
[im 10/22]
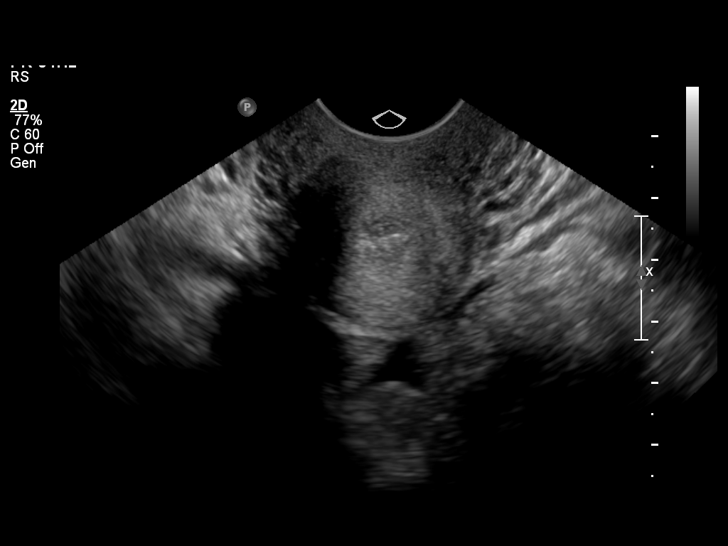
[im 12/22]
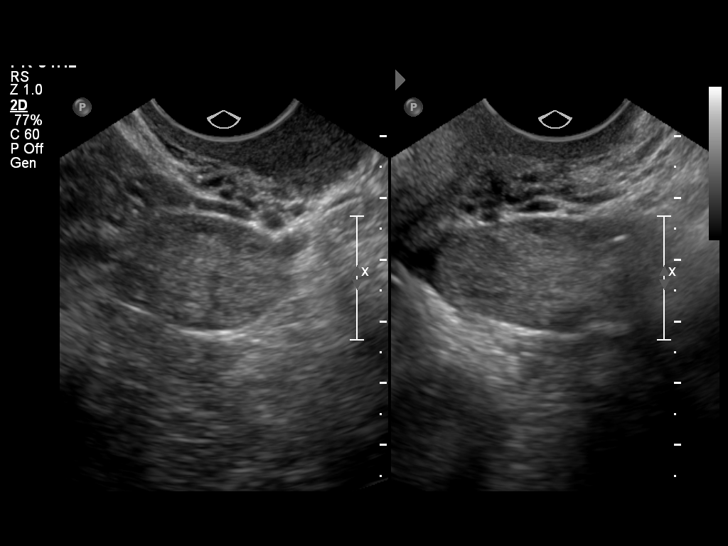
[im 13/22]
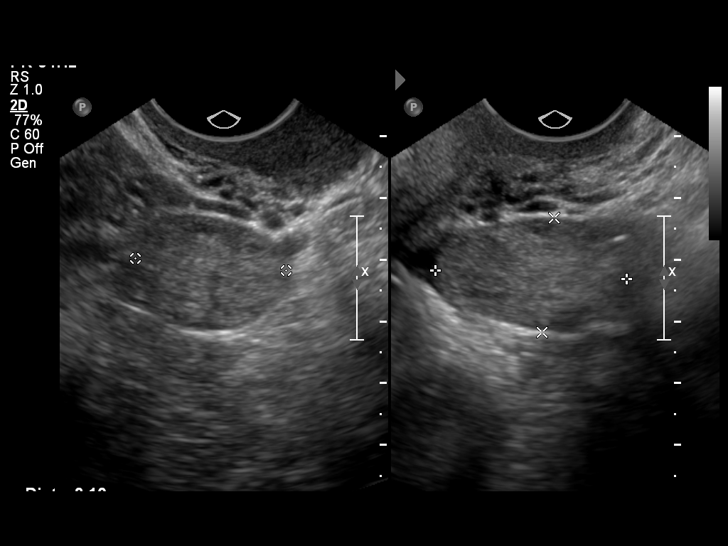
[im 15/22]
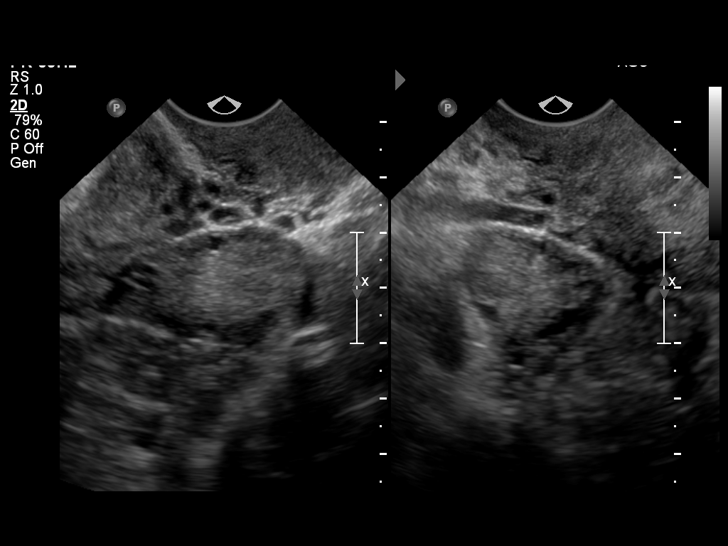
[im 17/22]
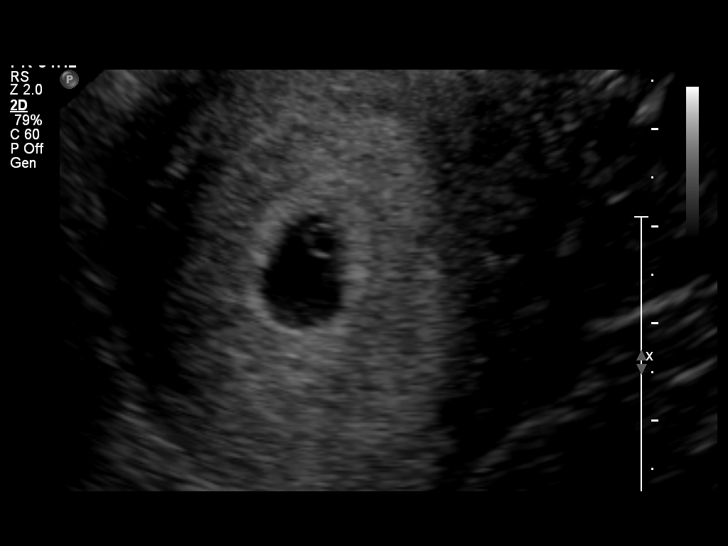
[im 18/22]
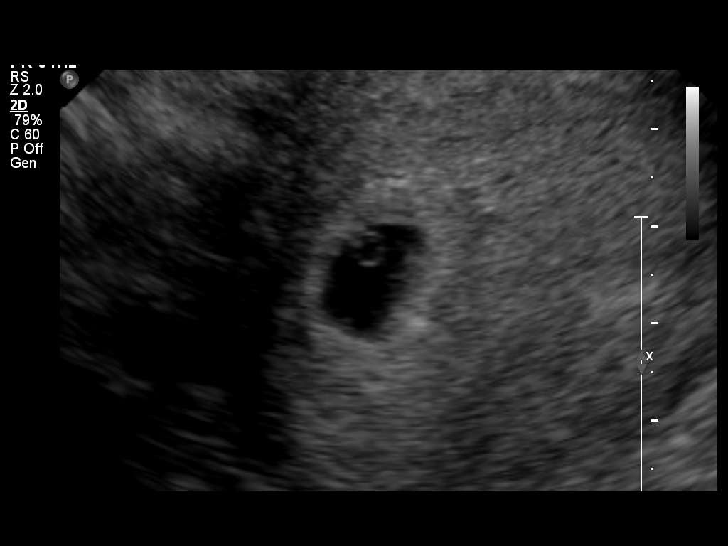
[im 20/22]
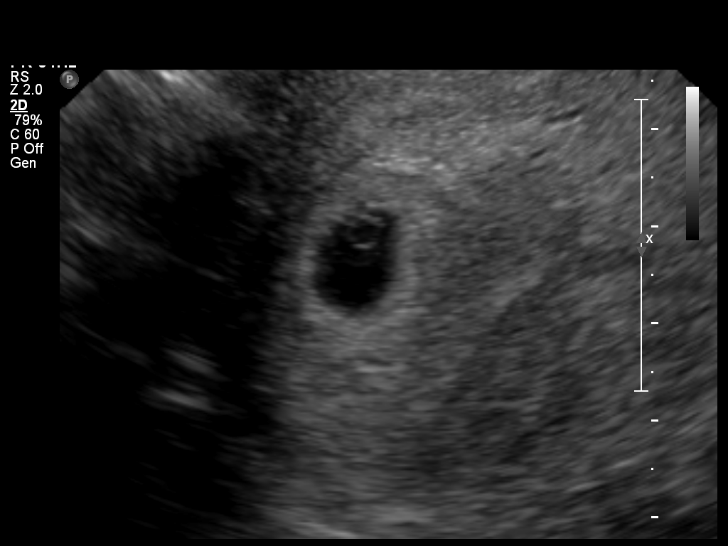
[im 22/22]
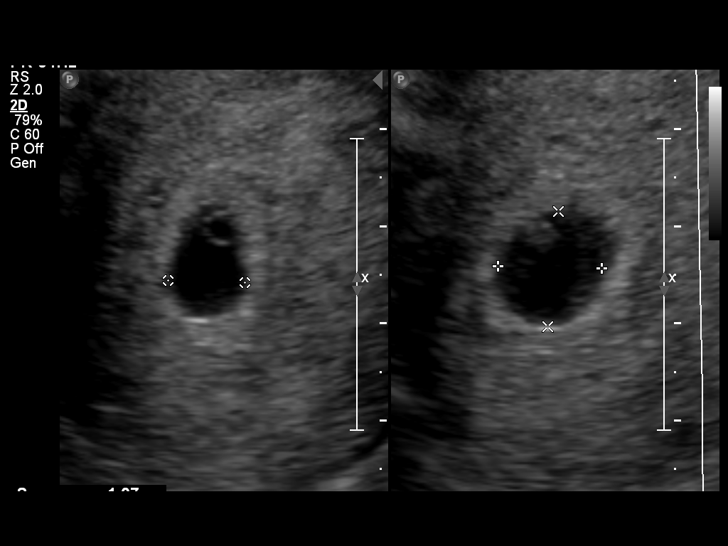

[13 of 22 positions shown; findings below may reference images not displayed]

OBSTETRICS REPORT
                      (Signed Final 06/23/2010 [DATE])

 Order#:         [id number]
Procedures

 US OB TRANSVAGINAL                                    76817.0
Indications

 Assess viability
Fetal Evaluation

 Preg. Location:    Intrauterine
 Gest. Sac:         Intrauterine
 Yolk Sac:          Visualized
 Fetal Pole:        Not visualized
 Cardiac Activity:  No embryo visualized
Biometry

 GS:      10.2  mm     G. Age:  5w 5d                  EDD:    02/18/11
Gestational Age

 LMP:           6w 2d         Date:  05/10/10                 EDD:   02/14/11
 Best:          6w 2d      Det. By:  LMP  (05/10/10)          EDD:   02/14/11
Cervix Uterus Adnexa

 Cervix:       Normal appearance by transvaginal scan
 Uterus:       No abnormality visualized.
 Cul De Sac:   Trace amount of free fluid seen.
 Left Ovary:    Within normal limits measuring 3.1 x 1.9 x 2.5 cm.
 Right Ovary:   Within normal limits measuring 3.5 x 2.1 x 2.6 cm.

 Adnexa:     No adnexal mass visualized.
Impression

  Single intrauterine gestational sac with yolk sac. No fetal
 pole is yet seen, but not expected at today's MSD of
 mm. Given normal growth rates, we would typically expect to
 see a fetal pole in 10 days. Correlation with serial bHCG with
 follow up sonography as indicated is recommended.
 Normal ovaries.

## 2012-01-13 ENCOUNTER — Emergency Department (HOSPITAL_BASED_OUTPATIENT_CLINIC_OR_DEPARTMENT_OTHER)
Admission: EM | Admit: 2012-01-13 | Discharge: 2012-01-13 | Disposition: A | Discharge status: Home / Self Care | Payer: Self-pay | Attending: Emergency Medicine | Admitting: Emergency Medicine

## 2012-01-13 ENCOUNTER — Encounter (HOSPITAL_BASED_OUTPATIENT_CLINIC_OR_DEPARTMENT_OTHER): Payer: Self-pay | Admitting: *Deleted

## 2012-01-13 DIAGNOSIS — K0889 Other specified disorders of teeth and supporting structures: Secondary | ICD-10-CM

## 2012-01-13 DIAGNOSIS — K089 Disorder of teeth and supporting structures, unspecified: Secondary | ICD-10-CM | POA: Insufficient documentation

## 2012-01-13 DIAGNOSIS — F17200 Nicotine dependence, unspecified, uncomplicated: Secondary | ICD-10-CM | POA: Insufficient documentation

## 2012-01-13 MED ORDER — HYDROCODONE-ACETAMINOPHEN 5-325 MG PO TABS: 2.0000 | ORAL_TABLET | Freq: Three times a day (TID) | ORAL | Status: DC | PRN

## 2012-01-13 MED ORDER — CLINDAMYCIN HCL 150 MG PO CAPS: 300.0000 mg | ORAL_CAPSULE | Freq: Three times a day (TID) | ORAL | Status: DC

## 2012-01-13 NOTE — ED Notes (Signed)
Pt presents to ED today with lower/back dental pain.  Pt has poor dental hygeine in back of mouth around wisdom teeth and area appears red and swollen.  Pt does not have regular dentist and is waiting on Medicaid before being seen

## 2012-01-13 NOTE — ED Provider Notes (Signed)
History     CSN: [id number]  Arrival date & time 01/13/12  1015   First MD Initiated Contact with Patient 01/13/12 1028      Chief Complaint  Patient presents with  . Dental Pain    (Consider location/radiation/quality/duration/timing/severity/associated sxs/prior treatment) HPI The patient presents with concerns of one week of facial pain.  She notes her symptoms began insidiously.  Since onset the pain has been worsening.  The pain has not improved with OTC medication.  The pain is focally about the left lower face with radiation towards the ear.  The pain is sharp, worse with chewing.  She notes no fever, chills, no nausea, no vomiting, and dyspnea, no dysphagia. The patient still has her wisdom teeth. Past Medical History  Diagnosis Date  . No pertinent past medical history   . Gestational diabetes     G2    Past Surgical History  Procedure Date  . No past surgeries     Family History  Problem Relation Age of Onset  . Asthma Father   . Asthma Daughter   . Cancer Maternal Grandmother     lung  . Diabetes Paternal Grandmother     History  Substance Use Topics  . Smoking status: Current Every Day Smoker -- 0.2 packs/day for 10 years    Types: Cigarettes    Last Attempt to Quit: 06/16/2010  . Smokeless tobacco: Never Used  . Alcohol Use: No    OB History    Grav Para Term Preterm Abortions TAB SAB Ect Mult Living   3 3 3  0 0 0 0 0 0 2      Review of Systems  All other systems reviewed and are negative.    Allergies  Aspirin and Penicillins  Home Medications   Current Outpatient Rx  Name  Route  Sig  Dispense  Refill  . PRENATAL PLUS 27-1 MG PO TABS   Oral   Take 1 tablet by mouth daily.           . ACETAMINOPHEN 500 MG PO TABS   Oral   Take 500 mg by mouth every 6 (six) hours as needed. For pain          . CLINDAMYCIN HCL 150 MG PO CAPS   Oral   Take 2 capsules (300 mg total) by mouth 3 (three) times daily.   42 capsule   0   .  HYDROCODONE-ACETAMINOPHEN 5-325 MG PO TABS   Oral   Take 2 tablets by mouth every 8 (eight) hours as needed for pain.   12 tablet   0     BP 129/73  Pulse 79  Temp 98.8 F (37.1 C) (Oral)  Resp 20  Ht 5\' 4"  (1.626 m)  Wt 150 lb (68.04 kg)  BMI 25.75 kg/m2  SpO2 100%  Physical Exam  Nursing note and vitals reviewed. Constitutional: She is oriented to person, place, and time. She appears well-developed and well-nourished. No distress.  HENT:  Head: Normocephalic and atraumatic. No trismus in the jaw.  Mouth/Throat: Uvula is midline, oropharynx is clear and moist and mucous membranes are normal.    Eyes: Conjunctivae normal and EOM are normal.  Cardiovascular: Normal rate and regular rhythm.   Pulmonary/Chest: Effort normal and breath sounds normal. No stridor. No respiratory distress.  Abdominal: She exhibits no distension.  Musculoskeletal: She exhibits no edema.  Neurological: She is alert and oriented to person, place, and time. No cranial nerve deficit.  Skin: Skin is  warm and dry.  Psychiatric: She has a normal mood and affect.    ED Course  Procedures (including critical care time)  Labs Reviewed - No data to display No results found.   1. Pain, dental       MDM  This generally well-appearing female, presents in no distress with unremarkable vital signs 2 to ongoing facial pain.  On exam she has an impacted wisdom tooth with surrounding erythema.  There is some suspicion of early infection.  We discussed the need for definitive dental care at length.  She was discharged in stable condition.  Gerhard Munch, MD 01/13/12 1051

## 2012-01-13 NOTE — ED Notes (Signed)
Pt presents to ED today without indwelling IV catheter.  Not charted as removed from previous EPIC encounter.  

## 2012-03-24 ENCOUNTER — Encounter (HOSPITAL_BASED_OUTPATIENT_CLINIC_OR_DEPARTMENT_OTHER): Payer: Self-pay | Admitting: *Deleted

## 2012-03-24 ENCOUNTER — Emergency Department (HOSPITAL_BASED_OUTPATIENT_CLINIC_OR_DEPARTMENT_OTHER)
Admission: EM | Admit: 2012-03-24 | Discharge: 2012-03-24 | Disposition: A | Discharge status: Home / Self Care | Payer: Self-pay | Attending: Emergency Medicine | Admitting: Emergency Medicine

## 2012-03-24 DIAGNOSIS — F17200 Nicotine dependence, unspecified, uncomplicated: Secondary | ICD-10-CM | POA: Insufficient documentation

## 2012-03-24 DIAGNOSIS — K529 Noninfective gastroenteritis and colitis, unspecified: Secondary | ICD-10-CM

## 2012-03-24 DIAGNOSIS — R197 Diarrhea, unspecified: Secondary | ICD-10-CM | POA: Insufficient documentation

## 2012-03-24 DIAGNOSIS — R109 Unspecified abdominal pain: Secondary | ICD-10-CM | POA: Insufficient documentation

## 2012-03-24 DIAGNOSIS — K5289 Other specified noninfective gastroenteritis and colitis: Secondary | ICD-10-CM | POA: Insufficient documentation

## 2012-03-24 DIAGNOSIS — Z8632 Personal history of gestational diabetes: Secondary | ICD-10-CM | POA: Insufficient documentation

## 2012-03-24 DIAGNOSIS — Z3202 Encounter for pregnancy test, result negative: Secondary | ICD-10-CM | POA: Insufficient documentation

## 2012-03-24 LAB — CBC WITH DIFFERENTIAL/PLATELET
Eosinophils Absolute: 0.1 10*3/uL (ref 0.0–0.7)
Hemoglobin: 16 g/dL — ABNORMAL HIGH (ref 12.0–15.0)
Lymphocytes Relative: 4 % — ABNORMAL LOW (ref 12–46)
Lymphs Abs: 1 10*3/uL (ref 0.7–4.0)
MCH: 30.1 pg (ref 26.0–34.0)
Monocytes Relative: 3 % (ref 3–12)
Neutro Abs: 21.2 10*3/uL — ABNORMAL HIGH (ref 1.7–7.7)
Neutrophils Relative %: 92 % — ABNORMAL HIGH (ref 43–77)
Platelets: 270 10*3/uL (ref 150–400)
RBC: 5.32 MIL/uL — ABNORMAL HIGH (ref 3.87–5.11)
WBC: 23 10*3/uL — ABNORMAL HIGH (ref 4.0–10.5)

## 2012-03-24 LAB — URINALYSIS, ROUTINE W REFLEX MICROSCOPIC
Nitrite: NEGATIVE
Specific Gravity, Urine: 1.027 (ref 1.005–1.030)
Urobilinogen, UA: 1 mg/dL (ref 0.0–1.0)
pH: 7 (ref 5.0–8.0)

## 2012-03-24 LAB — COMPREHENSIVE METABOLIC PANEL
ALT: 16 U/L (ref 0–35)
Alkaline Phosphatase: 95 U/L (ref 39–117)
BUN: 10 mg/dL (ref 6–23)
Chloride: 101 mEq/L (ref 96–112)
GFR calc Af Amer: >90 mL/min (ref >90)
Glucose, Bld: 155 mg/dL — ABNORMAL HIGH (ref 70–99)
Potassium: 3.6 mEq/L (ref 3.5–5.1)
Sodium: 139 mEq/L (ref 135–145)
Total Bilirubin: 0.4 mg/dL (ref 0.3–1.2)
Total Protein: 7.7 g/dL (ref 6.0–8.3)

## 2012-03-24 LAB — PREGNANCY, URINE: Preg Test, Ur: NEGATIVE

## 2012-03-24 LAB — URINE MICROSCOPIC-ADD ON

## 2012-03-24 MED ORDER — KETOROLAC TROMETHAMINE 30 MG/ML IJ SOLN
30.0000 mg | Freq: Once | INTRAMUSCULAR | Status: AC
  Administered 2012-03-24: 30 mg via INTRAVENOUS
  Filled 2012-03-24: qty 1

## 2012-03-24 MED ORDER — ONDANSETRON HCL 8 MG PO TABS: 8.0000 mg | ORAL_TABLET | ORAL | Status: DC | PRN

## 2012-03-24 MED ORDER — CIPROFLOXACIN HCL 500 MG PO TABS: 500.0000 mg | ORAL_TABLET | Freq: Two times a day (BID) | ORAL | Status: DC

## 2012-03-24 MED ORDER — ONDANSETRON HCL 4 MG/2ML IJ SOLN
4.0000 mg | Freq: Once | INTRAMUSCULAR | Status: AC
  Administered 2012-03-24: 4 mg via INTRAVENOUS
  Filled 2012-03-24: qty 2

## 2012-03-24 MED ORDER — SODIUM CHLORIDE 0.9 % IV SOLN
Freq: Once | INTRAVENOUS | Status: AC
  Administered 2012-03-24: 21:00:00 via INTRAVENOUS

## 2012-03-24 NOTE — ED Provider Notes (Signed)
History   This chart was scribed for Geoffery Lyons, MD by Melba Coon, ED Scribe. The patient was seen in room MH06/MH06 and the patient's care was started at 9:45PM.    CSN: [id number]  Arrival date & time 03/24/12  2009   First MD Initiated Contact with Patient 03/24/12 2134      Chief Complaint  Patient presents with  . Emesis    (Consider location/radiation/quality/duration/timing/severity/associated sxs/prior treatment) The history is provided by the patient. No language interpreter was used.   Krystal Cardenas is a 32 y.o. female who presents to the Emergency Department complaining of persistent, moderate to severe, crampaing upper abdominal pain with associated nausea, emesis and diarrhea with a sudden onset 4:00PM today. She was relaxing at home when the symptoms started and does not know what prompted the symptoms. She has not been around sick individuals at home. Reports chills. Denies HA, fever, neck pain, sore throat, rash, back pain, CP, SOB, hematuria, hematemesis, hematochezia, dysuria, or extremity pain, edema, weakness, numbness, or tingling. No other pertinent medical symptoms.   Past Medical History  Diagnosis Date  . No pertinent past medical history   . Gestational diabetes     G2    Past Surgical History  Procedure Date  . No past surgeries     Family History  Problem Relation Age of Onset  . Asthma Father   . Asthma Daughter   . Cancer Maternal Grandmother     lung  . Diabetes Paternal Grandmother     History  Substance Use Topics  . Smoking status: Current Every Day Smoker -- 0.2 packs/day for 10 years    Types: Cigarettes    Last Attempt to Quit: 06/16/2010  . Smokeless tobacco: Never Used  . Alcohol Use: No    OB History    Grav Para Term Preterm Abortions TAB SAB Ect Mult Living   3 3 3  0 0 0 0 0 0 2      Review of Systems  Gastrointestinal: Positive for vomiting.   10 Systems reviewed and all are negative for acute change  except as noted in the HPI.   Allergies  Aspirin and Penicillins  Home Medications   Current Outpatient Rx  Name  Route  Sig  Dispense  Refill  . ACETAMINOPHEN 500 MG PO TABS   Oral   Take 500 mg by mouth every 6 (six) hours as needed. For pain          . CLINDAMYCIN HCL 150 MG PO CAPS   Oral   Take 2 capsules (300 mg total) by mouth 3 (three) times daily.   42 capsule   0   . HYDROCODONE-ACETAMINOPHEN 5-325 MG PO TABS   Oral   Take 2 tablets by mouth every 8 (eight) hours as needed for pain.   12 tablet   0   . PRENATAL PLUS 27-1 MG PO TABS   Oral   Take 1 tablet by mouth daily.             BP 123/64  Pulse 96  Temp 98.3 F (36.8 C) (Oral)  Resp 20  Ht 5\' 4"  (1.626 m)  Wt 146 lb (66.225 kg)  BMI 25.06 kg/m2  SpO2 100%  LMP 03/06/2012  Physical Exam  Nursing note and vitals reviewed. Constitutional: She is oriented to person, place, and time. She appears well-developed and well-nourished.       Awake, alert, nontoxic appearance.  HENT:  Head: Normocephalic and atraumatic.  Eyes:  EOM are normal. Pupils are equal, round, and reactive to light. Right eye exhibits no discharge. Left eye exhibits no discharge.  Neck: Normal range of motion. Neck supple.  Cardiovascular: Normal rate, regular rhythm and normal heart sounds.   No murmur heard. Pulmonary/Chest: Effort normal and breath sounds normal. No respiratory distress. She exhibits no tenderness.  Abdominal: Soft. Bowel sounds are normal. She exhibits no mass. There is tenderness (mild TTP epigastric). There is no rebound and no guarding.  Musculoskeletal: She exhibits no tenderness.       Baseline ROM, no obvious new focal weakness.  Neurological: She is alert and oriented to person, place, and time.       Mental status and motor strength appears baseline for patient and situation.  Skin: No rash noted.  Psychiatric: She has a normal mood and affect.    ED Course  Procedures (including critical care  time)  DIAGNOSTIC STUDIES: Oxygen Saturation is 100% on room air, normal by my interpretation.    COORDINATION OF CARE:  9:49PM - toradol, UA, IV fluids, and zofran will be ordered for [facility].   10:40PM - lab results reviewed Labs Reviewed  URINALYSIS, ROUTINE W REFLEX MICROSCOPIC - Abnormal; Notable for the following:    APPearance CLOUDY (*)     Ketones, ur 40 (*)     Leukocytes, UA MODERATE (*)     All other components within normal limits  CBC WITH DIFFERENTIAL - Abnormal; Notable for the following:    WBC 23.0 (*)     RBC 5.32 (*)     Hemoglobin 16.0 (*)     HCT 46.7 (*)     Neutrophils Relative 92 (*)     Neutro Abs 21.2 (*)     Lymphocytes Relative 4 (*)     All other components within normal limits  COMPREHENSIVE METABOLIC PANEL - Abnormal; Notable for the following:    Glucose, Bld 155 (*)     All other components within normal limits  URINE MICROSCOPIC-ADD ON - Abnormal; Notable for the following:    Squamous Epithelial / LPF MANY (*)     Bacteria, UA MANY (*)     All other components within normal limits  PREGNANCY, URINE  LIPASE, BLOOD  URINE CULTURE   No results found.   No diagnosis found.    MDM  The patient presents here with symptoms consistent with gastroenteritis.  She was given fluids, meds and is feeling better.  The labs show an elevated wbc of 23k with urine that is suggestive of a uti.  It is my assessment that this is gastroenteritis.  She was re-examined and the abdomen is benign without any ttp whatsoever.  I am reluctant to treat the urine with antibiotics as I believe this may aggravate the gi issues.  I will give her a prescription for cipro for her to take if she develops any urinary symptoms.  She will return if her symptoms worsen or change.       I personally performed the services described in this documentation, which was scribed in my presence. The recorded information has been reviewed and is accurate.           Geoffery Lyons, MD 03/24/12 2245

## 2012-03-24 NOTE — ED Notes (Signed)
Given ice chips.

## 2012-03-24 NOTE — ED Notes (Signed)
C/o vomiting that started today. C/o diarrhea as well. Last emesis was 30 minutes ago. Fevers unknown. C/o chills. C/o upper general abd pain. Denies any urinary symptoms.

## 2012-03-25 LAB — URINE CULTURE

## 2013-11-09 ENCOUNTER — Emergency Department (HOSPITAL_BASED_OUTPATIENT_CLINIC_OR_DEPARTMENT_OTHER)
Admission: EM | Admit: 2013-11-09 | Discharge: 2013-11-09 | Disposition: A | Discharge status: Home / Self Care | Payer: Medicaid Other | Attending: Emergency Medicine | Admitting: Emergency Medicine

## 2013-11-09 ENCOUNTER — Encounter (HOSPITAL_BASED_OUTPATIENT_CLINIC_OR_DEPARTMENT_OTHER): Payer: Self-pay | Admitting: Emergency Medicine

## 2013-11-09 DIAGNOSIS — R21 Rash and other nonspecific skin eruption: Secondary | ICD-10-CM | POA: Insufficient documentation

## 2013-11-09 DIAGNOSIS — Z79899 Other long term (current) drug therapy: Secondary | ICD-10-CM | POA: Insufficient documentation

## 2013-11-09 DIAGNOSIS — Z880 Allergy status to penicillin: Secondary | ICD-10-CM | POA: Insufficient documentation

## 2013-11-09 DIAGNOSIS — Z792 Long term (current) use of antibiotics: Secondary | ICD-10-CM | POA: Insufficient documentation

## 2013-11-09 DIAGNOSIS — F17200 Nicotine dependence, unspecified, uncomplicated: Secondary | ICD-10-CM | POA: Insufficient documentation

## 2013-11-09 DIAGNOSIS — IMO0002 Reserved for concepts with insufficient information to code with codable children: Secondary | ICD-10-CM | POA: Insufficient documentation

## 2013-11-09 DIAGNOSIS — L509 Urticaria, unspecified: Secondary | ICD-10-CM | POA: Insufficient documentation

## 2013-11-09 MED ORDER — METHYLPREDNISOLONE SODIUM SUCC 125 MG IJ SOLR
125.0000 mg | Freq: Once | INTRAMUSCULAR | Status: AC
  Administered 2013-11-09: 125 mg via INTRAMUSCULAR
  Filled 2013-11-09: qty 2

## 2013-11-09 MED ORDER — HYDROXYZINE HCL 25 MG PO TABS
50.0000 mg | ORAL_TABLET | Freq: Once | ORAL | Status: AC
  Administered 2013-11-09: 50 mg via ORAL
  Filled 2013-11-09: qty 2

## 2013-11-09 MED ORDER — HYDROXYZINE HCL 25 MG PO TABS: 25.0000 mg | ORAL_TABLET | Freq: Four times a day (QID) | ORAL | Status: DC | PRN

## 2013-11-09 MED ORDER — METHYLPREDNISOLONE 4 MG PO KIT: PACK | ORAL | Status: DC

## 2013-11-09 NOTE — ED Notes (Signed)
MD with pt  

## 2013-11-09 NOTE — ED Notes (Signed)
Pt alert, NAD, calm, interactive, resps e/u, speaking in clear complete sentences, VSS, no dyspnea noted, LS CTA, no swelling noted, reports mild itching and redness/rash. (denies: pain, HA, visual changes, sob, nvd, fever or other sx).

## 2013-11-09 NOTE — ED Provider Notes (Addendum)
CSN: [id number]     Arrival date & time 11/09/13  0218 History   First MD Initiated Contact with Patient 11/09/13 0302     Chief Complaint  Patient presents with  . Rash     (Consider location/radiation/quality/duration/timing/severity/associated sxs/prior Treatment) HPI This is a 33 year old female who developed an urticarial rash 3 days ago. She was seen at [facility] and placed on Benadryl and prednisone. She was prescribed 40 mg of prednisone daily for 4 days. She states the physician attributed the rash to a reaction to Keflex which she had discontinued 7 days earlier. She is otherwise unaware of any potential triggers. The rash transiently improved but worsened yesterday evening. She describes it as severely itchy. It is also evanescent but seems to worsen with scratching. She denies any associated throat swelling, shortness of breath, nausea, vomiting or diarrhea.  Past Medical History  Diagnosis Date  . No pertinent past medical history    Past Surgical History  Procedure Laterality Date  . No past surgeries     Family History  Problem Relation Age of Onset  . Asthma Father   . Asthma Daughter   . Cancer Maternal Grandmother     lung  . Diabetes Paternal Grandmother    History  Substance Use Topics  . Smoking status: Current Every Day Smoker -- 0.25 packs/day for 10 years    Types: Cigarettes    Last Attempt to Quit: 06/16/2010  . Smokeless tobacco: Never Used  . Alcohol Use: No   OB History   Grav Para Term Preterm Abortions TAB SAB Ect Mult Living   0 0 0 0 0 0 2     Review of Systems  All other systems reviewed and are negative.   Allergies  Amoxicillin; Aspirin; and Penicillins  Home Medications   Prior to Admission medications   Medication Sig Start Date End Date Taking? Authorizing Provider  PREDNISONE, PAK, PO Take by mouth. Pt not sure of the dose.   Yes Historical Provider, MD  acetaminophen (TYLENOL) 500 MG tablet Take 500  mg by mouth every 6 (six) hours as needed. For pain     Historical Provider, MD  ciprofloxacin (CIPRO) 500 MG tablet Take 1 tablet (500 mg total) by mouth 2 (two) times daily. One po bid x 7 days 03/24/12   Geoffery Lyons, MD  clindamycin (CLEOCIN) 150 MG capsule Take 2 capsules (300 mg total) by mouth 3 (three) times daily. 01/13/12   Gerhard Munch, MD  HYDROcodone-acetaminophen (NORCO/VICODIN) 5-325 MG per tablet Take 2 tablets by mouth every 8 (eight) hours as needed for pain. 01/13/12   Gerhard Munch, MD  ondansetron (ZOFRAN) 8 MG tablet Take 1 tablet (8 mg total) by mouth every 4 (four) hours as needed for nausea. 03/24/12   Geoffery Lyons, MD  prenatal vitamin w/FE, FA (PRENATAL 1 + 1) 27-1 MG TABS Take 1 tablet by mouth daily.      Historical Provider, MD   BP 140/76  Pulse 72  Temp(Src) 98.1 F (36.7 C) (Oral)  SpO2 100%  LMP 10/06/2013  Physical Exam General: Well-developed, well-nourished female in no acute distress; appearance consistent with age of record HENT: normocephalic; atraumatic; dental caries Eyes: pupils equal, round and reactive to light; extraocular muscles intact Neck: supple Heart: regular rate and rhythm Lungs: clear to auscultation bilaterally Abdomen: soft; nondistended; nontender; bowel sounds present Extremities: No deformity; full range of motion; pulses normal Neurologic: Awake, alert and oriented; motor function intact in  all extremities and symmetric; no facial droop Skin: Warm and dry; generalized urticarial rash Psychiatric: Normal mood and affect   ED Course  Procedures (including critical care time)  MDM      Hanley Seamen, MD 11/09/13 1610  Hanley Seamen, MD 11/09/13 [id number]

## 2013-11-09 NOTE — ED Notes (Addendum)
Pt states that she was seen at [facility] on Thursday and seen for a general rash. States rash was everywhere but her arms. States she was told it may have been an antibiotic allergy. States she was given prednisone and benadryl. States she has been taking meds which have been helping but states rash has returned today and now on her arms. Prednisone taken this morning and benadryl 2 hours pta. General scattered welts noted. Denies any sob. C/o itching. Denies any new products.

## 2014-01-05 ENCOUNTER — Encounter (HOSPITAL_BASED_OUTPATIENT_CLINIC_OR_DEPARTMENT_OTHER): Payer: Self-pay | Admitting: Emergency Medicine

## 2017-06-22 ENCOUNTER — Emergency Department (HOSPITAL_BASED_OUTPATIENT_CLINIC_OR_DEPARTMENT_OTHER)
Admission: EM | Admit: 2017-06-22 | Discharge: 2017-06-22 | Disposition: A | Discharge status: Home / Self Care | Payer: Medicaid Other | Attending: Emergency Medicine | Admitting: Emergency Medicine

## 2017-06-22 ENCOUNTER — Emergency Department (HOSPITAL_BASED_OUTPATIENT_CLINIC_OR_DEPARTMENT_OTHER): Payer: Medicaid Other

## 2017-06-22 ENCOUNTER — Encounter (HOSPITAL_BASED_OUTPATIENT_CLINIC_OR_DEPARTMENT_OTHER): Payer: Self-pay | Admitting: Emergency Medicine

## 2017-06-22 ENCOUNTER — Other Ambulatory Visit: Payer: Self-pay

## 2017-06-22 DIAGNOSIS — N3001 Acute cystitis with hematuria: Secondary | ICD-10-CM

## 2017-06-22 DIAGNOSIS — N939 Abnormal uterine and vaginal bleeding, unspecified: Secondary | ICD-10-CM | POA: Diagnosis present

## 2017-06-22 DIAGNOSIS — F17210 Nicotine dependence, cigarettes, uncomplicated: Secondary | ICD-10-CM | POA: Insufficient documentation

## 2017-06-22 DIAGNOSIS — Z79899 Other long term (current) drug therapy: Secondary | ICD-10-CM | POA: Diagnosis not present

## 2017-06-22 DIAGNOSIS — R1030 Lower abdominal pain, unspecified: Secondary | ICD-10-CM

## 2017-06-22 HISTORY — DX: Other chronic pain: G89.29

## 2017-06-22 HISTORY — DX: Other seasonal allergic rhinitis: J30.2

## 2017-06-22 HISTORY — DX: Low back pain: M54.5

## 2017-06-22 HISTORY — DX: Gastro-esophageal reflux disease without esophagitis: K21.9

## 2017-06-22 HISTORY — DX: Low back pain, unspecified: M54.50

## 2017-06-22 HISTORY — DX: Anxiety disorder, unspecified: F41.9

## 2017-06-22 LAB — URINALYSIS, MICROSCOPIC (REFLEX)

## 2017-06-22 LAB — CBC WITH DIFFERENTIAL/PLATELET
BASOS ABS: 0.1 10*3/uL (ref 0.0–0.1)
Basophils Relative: 1 %
EOS ABS: 0.3 10*3/uL (ref 0.0–0.7)
Eosinophils Relative: 4 %
HCT: 38.4 % (ref 36.0–46.0)
HEMOGLOBIN: 13.5 g/dL (ref 12.0–15.0)
Lymphocytes Relative: 20 %
Lymphs Abs: 1.8 10*3/uL (ref 0.7–4.0)
MCH: 29.1 pg (ref 26.0–34.0)
MCHC: 35.2 g/dL (ref 30.0–36.0)
MCV: 82.8 fL (ref 78.0–100.0)
Monocytes Absolute: 0.7 10*3/uL (ref 0.1–1.0)
Monocytes Relative: 8 %
NEUTROS PCT: 67 %
Neutro Abs: 5.8 10*3/uL (ref 1.7–7.7)
Platelets: 338 10*3/uL (ref 150–400)
RBC: 4.64 MIL/uL (ref 3.87–5.11)
RDW: 14.9 % (ref 11.5–15.5)
WBC: 8.6 10*3/uL (ref 4.0–10.5)

## 2017-06-22 LAB — WET PREP, GENITAL
Clue Cells Wet Prep HPF POC: NONE SEEN
Sperm: NONE SEEN
Trich, Wet Prep: NONE SEEN
WBC, Wet Prep HPF POC: NONE SEEN
Yeast Wet Prep HPF POC: NONE SEEN

## 2017-06-22 LAB — URINALYSIS, ROUTINE W REFLEX MICROSCOPIC
Bilirubin Urine: NEGATIVE
GLUCOSE, UA: NEGATIVE mg/dL
Ketones, ur: NEGATIVE mg/dL
LEUKOCYTES UA: NEGATIVE
NITRITE: NEGATIVE
PH: 5.5 (ref 5.0–8.0)
Protein, ur: 30 mg/dL — AB

## 2017-06-22 LAB — COMPREHENSIVE METABOLIC PANEL
ALK PHOS: 84 U/L (ref 38–126)
ALT: 26 U/L (ref 14–54)
ANION GAP: 9 (ref 5–15)
AST: 22 U/L (ref 15–41)
Albumin: 4.1 g/dL (ref 3.5–5.0)
BUN: 12 mg/dL (ref 6–20)
CALCIUM: 8.7 mg/dL — AB (ref 8.9–10.3)
CO2: 21 mmol/L — ABNORMAL LOW (ref 22–32)
Chloride: 106 mmol/L (ref 101–111)
Creatinine, Ser: 0.74 mg/dL (ref 0.44–1.00)
GFR calc Af Amer: >60 mL/min (ref >60)
GFR calc non Af Amer: >60 mL/min (ref >60)
Glucose, Bld: 118 mg/dL — ABNORMAL HIGH (ref 65–99)
Potassium: 3.3 mmol/L — ABNORMAL LOW (ref 3.5–5.1)
Sodium: 136 mmol/L (ref 135–145)
Total Bilirubin: 0.4 mg/dL (ref 0.3–1.2)
Total Protein: 7 g/dL (ref 6.5–8.1)

## 2017-06-22 LAB — PREGNANCY, URINE: Preg Test, Ur: NEGATIVE

## 2017-06-22 LAB — LIPASE, BLOOD: Lipase: 34 U/L (ref 11–51)

## 2017-06-22 MED ORDER — SULFAMETHOXAZOLE-TRIMETHOPRIM 800-160 MG PO TABS: 1.0000 | ORAL_TABLET | Freq: Two times a day (BID) | ORAL | 0 refills | Status: AC

## 2017-06-22 MED FILL — SULFAMETHOXAZOLE-TMP DS TAB: 800-160 | 3 days supply | Qty: 6 | Fill #0

## 2017-06-22 NOTE — ED Triage Notes (Addendum)
lower abdominal pain since a few weeks ago.  Pt started having vaginal bleeding starting yesterday.  Pt not due for menstrual for 15 days.  Pt seen at PCP on last Friday for same.  Pt on doxy and flagyl

## 2017-06-22 NOTE — Discharge Instructions (Signed)
We will work-up today showed evidence of bleeding from your uterus that is likely also the source of your discomfort.  We did not find evidence of laceration or other infection in your pelvic exam and swabs.  We did not find evidence of torsion.  Given your incomplete treatment of the urinary tract infection you recently had and he continued bacteria in the urine with some symptoms, we will treat you with Bactrim which she reports has helped to significantly in the past.  Please follow-up with your PCP and your OB/GYN for further management.  Please stay hydrated.  If any symptoms change or worsen, please return to the nearest emergency department.

## 2017-06-22 NOTE — ED Provider Notes (Signed)
[facility] EMERGENCY DEPARTMENT Provider Note   CSN: [id number] Arrival date & time: 06/22/17  4098     History   Chief Complaint Chief Complaint  Patient presents with  . Vaginal Bleeding    HPI Krystal Cardenas is a 37 y.o. female.  The history is provided by the patient and medical records.  Vaginal Bleeding  Primary symptoms include vaginal bleeding.  Primary symptoms include no discharge, no pelvic pain, no genital pain, no genital rash, no genital itching, no genital odor, no dysuria. There has been no fever. This is a new problem. The problem occurs constantly. The problem has not changed since onset.The patient's menstrual history has been irregular. Associated symptoms include abdominal pain and frequency. Pertinent negatives include no anorexia, no diaphoresis, no abdominal swelling, no constipation, no diarrhea, no nausea, no vomiting and no light-headedness. She has tried nothing for the symptoms. The treatment provided no relief.  Abdominal Pain   This is a new problem. The current episode started more than 2 days ago. The problem occurs constantly. The problem has been gradually improving. The pain is associated with an unknown factor. The pain is located in the suprapubic region. The pain is moderate. Associated symptoms include frequency and hematuria. Pertinent negatives include anorexia, fever, diarrhea, nausea, vomiting, constipation, dysuria and headaches.    Past Medical History:  Diagnosis Date  . Anxiety   . Chronic lower back pain   . GERD (gastroesophageal reflux disease)   . Seasonal allergies     There are no active problems to display for this patient.   Past Surgical History:  Procedure Laterality Date  . NO PAST SURGERIES    . TUBAL LIGATION       OB History    Gravida  3   Para  3   Term  3   Preterm  0   AB  0   Living  2     SAB  0   TAB  0   Ectopic  0   Multiple  0   Live Births  2            Home  Medications    Prior to Admission medications   Medication Sig Start Date End Date Taking? Authorizing Provider  buPROPion (WELLBUTRIN XL) 300 MG 24 hr tablet Take 1 tablet by mouth daily. 09/24/15  Yes [provider]  cyclobenzaprine (FLEXERIL) 10 MG tablet Take 10 mg by mouth 4 (four) times daily as needed for muscle spasms.   Yes [provider]  doxycycline (DORYX) 100 MG EC tablet Take 100 mg by mouth 2 (two) times daily.   Yes [provider]  metroNIDAZOLE (FLAGYL) 500 MG tablet Take 500 mg by mouth 2 (two) times daily.   Yes [provider]  fluticasone (FLONASE) 50 MCG/ACT nasal spray Place 2 sprays into both nostrils 2 (two) times daily as needed. 04/17/17   [provider]  omeprazole (PRILOSEC) 20 MG capsule Take 1 capsule by mouth daily. 05/28/17   [provider]  traMADol (ULTRAM) 50 MG tablet Take 1 tablet by mouth every 6 (six) hours as needed. 05/15/17   [provider]    Family History Family History  Problem Relation Age of Onset  . Asthma Father   . Asthma Daughter   . Cancer Maternal Grandmother        lung  . Diabetes Paternal Grandmother     Social History Social History   Tobacco Use  .  Smoking status: Current Every Day Smoker    Packs/day: 0.25    Years: 10.00    Pack years: 2.50    Types: Cigarettes    Last attempt to quit: 06/16/2010    Years since quitting: 7.0  . Smokeless tobacco: Never Used  Substance Use Topics  . Alcohol use: No  . Drug use: No     Allergies   Amoxicillin; Aspirin; and Penicillins   Review of Systems Review of Systems  Constitutional: Negative for chills, diaphoresis, fatigue and fever.  HENT: Negative for congestion.   Eyes: Negative for visual disturbance.  Respiratory: Negative for cough, chest tightness, shortness of breath, wheezing and stridor.   Cardiovascular: Negative for chest pain and palpitations.  Gastrointestinal: Positive for abdominal pain.  Negative for anorexia, constipation, diarrhea, nausea and vomiting.  Genitourinary: Positive for frequency, hematuria and vaginal bleeding. Negative for dysuria, flank pain and pelvic pain.  Musculoskeletal: Negative for back pain, neck pain and neck stiffness.  Skin: Negative for rash and wound.  Neurological: Negative for light-headedness and headaches.  Psychiatric/Behavioral: Negative for agitation.  All other systems reviewed and are negative.    Physical Exam Updated Vital Signs BP (!) 157/93   Pulse 92   Temp 99.1 F (37.3 C) (Oral)   Resp 16   Ht 5\' 4"  (1.626 m)   Wt 82.6 kg (182 lb)   LMP 06/06/2017   SpO2 100%   BMI 31.24 kg/m   Physical Exam  Constitutional: She is oriented to person, place, and time. She appears well-developed and well-nourished. No distress.  HENT:  Head: Normocephalic and atraumatic.  Mouth/Throat: Oropharynx is clear and moist.  Eyes: Pupils are equal, round, and reactive to light. Conjunctivae and EOM are normal.  Neck: Normal range of motion. Neck supple.  Cardiovascular: Normal rate and intact distal pulses.  No murmur heard. Pulmonary/Chest: Effort normal and breath sounds normal. No respiratory distress. She has no rales. She exhibits no tenderness.  Abdominal: Soft. She exhibits no distension. There is no tenderness. There is no guarding.  Genitourinary: Cervix exhibits no motion tenderness, no discharge and no friability. Right adnexum displays no tenderness. Left adnexum displays no tenderness. There is bleeding in the vagina. No erythema or tenderness in the vagina. No signs of injury around the vagina. No vaginal discharge found.  Musculoskeletal: She exhibits no edema or tenderness.  Neurological: She is alert and oriented to person, place, and time. No sensory deficit. She exhibits normal muscle tone.  Skin: No rash noted. She is not diaphoretic. No erythema.  Psychiatric: She has a normal mood and affect.  Nursing note and vitals  reviewed.    ED Treatments / Results  Labs (all labs ordered are listed, but only abnormal results are displayed) Labs Reviewed  URINALYSIS, ROUTINE W REFLEX MICROSCOPIC - Abnormal; Notable for the following components:      Result Value   Color, Urine AMBER (*)    APPearance CLOUDY (*)    Specific Gravity, Urine >1.030 (*)    Hgb urine dipstick LARGE (*)    Protein, ur 30 (*)    All other components within normal limits  URINALYSIS, MICROSCOPIC (REFLEX) - Abnormal; Notable for the following components:   Bacteria, UA RARE (*)    Squamous Epithelial / LPF 0-5 (*)    All other components within normal limits  COMPREHENSIVE METABOLIC PANEL - Abnormal; Notable for the following components:   Potassium 3.3 (*)    CO2 21 (*)    Glucose, Bld  118 (*)    Calcium 8.7 (*)    All other components within normal limits  WET PREP, GENITAL  URINE CULTURE  PREGNANCY, URINE  CBC WITH DIFFERENTIAL/PLATELET  LIPASE, BLOOD  GC/CHLAMYDIA PROBE AMP ([place]) NOT AT [facility]    EKG None  Radiology US Transvaginal Non-ob  Result Date: 06/22/2017 CLINICAL DATA:  LEFT more than RIGHT LOWER abdominal pain and vaginal bleeding started yesterday. History of tubal ligation. LMP 06/06/2017. Gravida 3 para 3. EXAM: TRANSABDOMINAL AND TRANSVAGINAL ULTRASOUND OF PELVIS DOPPLER ULTRASOUND OF OVARIES TECHNIQUE: Both transabdominal and transvaginal ultrasound examinations of the pelvis were performed. Transabdominal technique was performed for global imaging of the pelvis including uterus, ovaries, adnexal regions, and pelvic cul-de-sac. It was necessary to proceed with endovaginal exam following the transabdominal exam to visualize the endometrium and ovaries. Color and duplex Doppler ultrasound was utilized to evaluate blood flow to the COMPARISON:  OB ultrasound 09/21/2010 and 06/23/2010 FINDINGS: Uterus Measurements: 9.3 x 5.7 x 6.3 centimeters. Along the anterior aspect of the uterus, there is hyperechoic  and irregular myometrial tissue raising the question of adenomyosis. No discrete uterine lesion is identified. Endometrium Thickness: 6.8 millimeters.  No focal abnormality visualized. Right ovary Measurements: 3.9 x 1.8 x 2.2 centimeters. Normal appearance/no adnexal mass. Left ovary Measurements: 3.0 x 2.0 x 2.1 centimeters. Normal appearance/no adnexal mass. Pulsed Doppler evaluation of both ovaries demonstrates normal low-resistance arterial and venous waveforms. Other findings Trace free pelvic fluid. IMPRESSION: 1. Heterogeneous echogenic myometrium raising the question of adenomyosis. 2. No discrete uterine or adnexal mass. 3. Normal appearance of both ovaries. 4. No evidence for torsion. Electronically Signed   By: Norva Pavlov M.D.   On: 06/22/2017 11:04   US Pelvis Complete  Result Date: 06/22/2017 CLINICAL DATA:  LEFT more than RIGHT LOWER abdominal pain and vaginal bleeding started yesterday. History of tubal ligation. LMP 06/06/2017. Gravida 3 para 3. EXAM: TRANSABDOMINAL AND TRANSVAGINAL ULTRASOUND OF PELVIS DOPPLER ULTRASOUND OF OVARIES TECHNIQUE: Both transabdominal and transvaginal ultrasound examinations of the pelvis were performed. Transabdominal technique was performed for global imaging of the pelvis including uterus, ovaries, adnexal regions, and pelvic cul-de-sac. It was necessary to proceed with endovaginal exam following the transabdominal exam to visualize the endometrium and ovaries. Color and duplex Doppler ultrasound was utilized to evaluate blood flow to the COMPARISON:  OB ultrasound 09/21/2010 and 06/23/2010 FINDINGS: Uterus Measurements: 9.3 x 5.7 x 6.3 centimeters. Along the anterior aspect of the uterus, there is hyperechoic and irregular myometrial tissue raising the question of adenomyosis. No discrete uterine lesion is identified. Endometrium Thickness: 6.8 millimeters.  No focal abnormality visualized. Right ovary Measurements: 3.9 x 1.8 x 2.2 centimeters. Normal  appearance/no adnexal mass. Left ovary Measurements: 3.0 x 2.0 x 2.1 centimeters. Normal appearance/no adnexal mass. Pulsed Doppler evaluation of both ovaries demonstrates normal low-resistance arterial and venous waveforms. Other findings Trace free pelvic fluid. IMPRESSION: 1. Heterogeneous echogenic myometrium raising the question of adenomyosis. 2. No discrete uterine or adnexal mass. 3. Normal appearance of both ovaries. 4. No evidence for torsion. Electronically Signed   By: Norva Pavlov M.D.   On: 06/22/2017 11:04   [place] Art/ven Flow Abd Pelv Doppler  Result Date: 06/22/2017 CLINICAL DATA:  LEFT more than RIGHT LOWER abdominal pain and vaginal bleeding started yesterday. History of tubal ligation. LMP 06/06/2017. Gravida 3 para 3. EXAM: TRANSABDOMINAL AND TRANSVAGINAL ULTRASOUND OF PELVIS DOPPLER ULTRASOUND OF OVARIES TECHNIQUE: Both transabdominal and transvaginal ultrasound examinations of the pelvis were performed. Transabdominal technique was performed  for global imaging of the pelvis including uterus, ovaries, adnexal regions, and pelvic cul-de-sac. It was necessary to proceed with endovaginal exam following the transabdominal exam to visualize the endometrium and ovaries. Color and duplex Doppler ultrasound was utilized to evaluate blood flow to the COMPARISON:  OB ultrasound 09/21/2010 and 06/23/2010 FINDINGS: Uterus Measurements: 9.3 x 5.7 x 6.3 centimeters. Along the anterior aspect of the uterus, there is hyperechoic and irregular myometrial tissue raising the question of adenomyosis. No discrete uterine lesion is identified. Endometrium Thickness: 6.8 millimeters.  No focal abnormality visualized. Right ovary Measurements: 3.9 x 1.8 x 2.2 centimeters. Normal appearance/no adnexal mass. Left ovary Measurements: 3.0 x 2.0 x 2.1 centimeters. Normal appearance/no adnexal mass. Pulsed Doppler evaluation of both ovaries demonstrates normal low-resistance arterial and venous waveforms. Other  findings Trace free pelvic fluid. IMPRESSION: 1. Heterogeneous echogenic myometrium raising the question of adenomyosis. 2. No discrete uterine or adnexal mass. 3. Normal appearance of both ovaries. 4. No evidence for torsion. Electronically Signed   By: Norva Pavlov M.D.   On: 06/22/2017 11:04    Procedures Procedures (including critical care time)  Medications Ordered in ED Medications - No data to display   Initial Impression / Assessment and Plan / ED Course  I have reviewed the triage vital signs and the nursing notes.  Pertinent labs & imaging results that were available during my care of the patient were reviewed by me and considered in my medical decision making (see chart for details).     Krystal Cardenas is a 37 y.o. female with a past medical history significant for GERD, anxiety, and recurrent urinary tract infections with recent diagnosis of both UTI and BV who presents with vaginal bleeding abdominal pain, and urinary frequency.  Patient reports that for the last week she has had abdominal discomfort in her suprapubic abdomen.  She reports it is cramping and moderate.  She reports it feels like she is going to have a menstrual cycle.  She reports that her third has been irregular over the last few months but she has never had early menstrual cycle.  She reports that the pain is been going on for the last week and is fluctuating but is improved this morning.  She reports that she had vaginal bleeding started yesterday.  She reports that she was diagnosed with both BV and a UTI last week and was started on doxycycline and Flagyl.  She reports that she took the Doxy for a day or 2 and then stopped because it made her nauseous.  She reports that she has continued the Flagyl and is still on it.  She reports no nausea, vomiting, constipation, or diarrhea.  She reports that she has had her tubes tied and is not pregnant.        She denies any abdominal trauma or other  complaints.  On exam, abdomen is nontender.  No focal tenderness felt.  CVA there is nontender.  Lungs clear and chest nontender.  Pelvic exam performed with mild bleeding from the cervical loss.  No evidence of laceration or injury seen.  Adnexa nontender.  No discharge.  Patient had swabs showing no evidence of BV.  Suspect this is being well treated.  Patient had laboratory testing and pelvic ultrasound performed.  Laboratory testing was overall reassuring.  No evidence of leukocytosis or anemia.  Mild elective abnormalities however given her tolerance of p.o. I suspect patient will continue to eat and drink well.  Lipase not elevated.  Pregnancy is  negative.    Urine showed no nitrites or leukocytes however in the setting of bacteria and the patient's symptoms with an incompletely treated urinary tract infection from several days ago, patient will be treated with antibiotics.  Patient reports that Bactrim has worked extremely well for her for UTIs in the past and requested to be given a prescription of Bactrim will be provided by discharge.  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  Suspect this is related to the patient's lower abdominal pain and vaginal bleeding.  Patient will follow up with her OB/GYN for this and stay hydrated.  As she does not appear to be anemic, do not feel patient needs admission.  Next para patient understood return precautions and follow-up instructions.  Patient had no other questions or concerns and was discharged in good condition.    Final Clinical Impressions(s) / ED Diagnoses   Final diagnoses:  Vaginal bleeding  Lower abdominal pain  Acute cystitis with hematuria    ED Discharge Orders        Ordered    sulfamethoxazole-trimethoprim (BACTRIM DS,SEPTRA DS) 800-160 MG tablet  2 times daily     06/22/17 1248     Clinical Impression: 1. Vaginal bleeding   2. Lower abdominal pain    3. Acute cystitis with hematuria     Disposition: Discharge  Condition: Good  I have discussed the results, Dx and Tx plan with the pt(& family if present). He/she/they expressed understanding and agree(s) with the plan. Discharge instructions discussed at great length. Strict return precautions discussed and pt &/or family have verbalized understanding of the instructions. No further questions at time of discharge.    New Prescriptions   SULFAMETHOXAZOLE-TRIMETHOPRIM (BACTRIM DS,SEPTRA DS) 800-160 MG TABLET    Take 1 tablet by mouth 2 (two) times daily for 3 days.    Follow Up: Maye HidesMiller, Ryan Dean, [place] [address] [place] [id number] [phone]     [facility] [address] [id number] [place] [id number] [phone]       Shatera Rennert, Canary Brimhristopher J, MD 06/22/17 1259

## 2017-06-23 LAB — URINE CULTURE: Culture: >=100000 — AB

## 2017-06-25 LAB — GC/CHLAMYDIA PROBE AMP (~~LOC~~) NOT AT ARMC
CHLAMYDIA, DNA PROBE: NEGATIVE
NEISSERIA GONORRHEA: NEGATIVE

## 2019-01-27 ENCOUNTER — Emergency Department (HOSPITAL_BASED_OUTPATIENT_CLINIC_OR_DEPARTMENT_OTHER)
Admission: EM | Admit: 2019-01-27 | Discharge: 2019-01-27 | Disposition: A | Discharge status: Home / Self Care | Payer: Medicaid Other | Attending: Emergency Medicine | Admitting: Emergency Medicine

## 2019-01-27 ENCOUNTER — Encounter (HOSPITAL_BASED_OUTPATIENT_CLINIC_OR_DEPARTMENT_OTHER): Payer: Self-pay | Admitting: *Deleted

## 2019-01-27 ENCOUNTER — Other Ambulatory Visit: Payer: Self-pay

## 2019-01-27 DIAGNOSIS — F419 Anxiety disorder, unspecified: Secondary | ICD-10-CM | POA: Insufficient documentation

## 2019-01-27 DIAGNOSIS — H9201 Otalgia, right ear: Secondary | ICD-10-CM | POA: Diagnosis present

## 2019-01-27 DIAGNOSIS — F17210 Nicotine dependence, cigarettes, uncomplicated: Secondary | ICD-10-CM | POA: Diagnosis not present

## 2019-01-27 MED ORDER — NAPROXEN 500 MG PO TABS: 500.0000 mg | ORAL_TABLET | Freq: Two times a day (BID) | ORAL | 0 refills | Status: AC

## 2019-01-27 MED ORDER — KETOROLAC TROMETHAMINE 30 MG/ML IJ SOLN
30.0000 mg | Freq: Once | INTRAMUSCULAR | Status: AC
  Administered 2019-01-27: 30 mg via INTRAMUSCULAR
  Filled 2019-01-27: qty 1

## 2019-01-27 MED ORDER — FLUTICASONE PROPIONATE 50 MCG/ACT NA SUSP: 2.0000 | Freq: Every day | NASAL | 0 refills | Status: AC

## 2019-01-27 NOTE — Discharge Instructions (Addendum)
You were seen today for right ear pain.  The cause of your pain at this time is unknown.  He has no obvious signs of infection.  Take naproxen as needed for pain.  Add Flonase to help drain the middle ear.

## 2019-01-27 NOTE — ED Triage Notes (Signed)
C/o right ear pain that started on Thursday. Was seen again on Sat and was placed on an antibiotic. Pt states her pain is not improving. Denies fevers. C/o voice being hoarse. States ear pain is constant. Has taken otc meds without relief.

## 2019-01-27 NOTE — ED Provider Notes (Signed)
[facility] EM[facility]ENC[facility] DEPARTMENT Provider Note   CSN: [id number] Arrival date & time: 01/27/19  0245     History   Chief Complaint Chief Complaint  Patient presents with  . right ear pain    HPI Krystal Cardenas is a 38 y.o. female.     HPI  This is a 38 year old female with a history of anxiety, reflux who presents with right otalgia.  Patient reports that she has right ear pain that radiates into her right neck.  Onset of symptoms was Thursday.  She has seen her primary physician twice.  She was subsequently started on a Z-Pak for presumed sinus infection.  She is not had any fever.  No congestion or rhinorrhea.  She does report a slight cough.  She has been taking Tylenol with minimal relief.  Denies sore throat but does have some "hoarse voice."  Patient reports that she has taken Mucinex and NyQuil with no relief.  She rates her pain 8 out of 10.  Past Medical History:  Diagnosis Date  . Anxiety   . Chronic lower back pain   . GERD (gastroesophageal reflux disease)   . Seasonal allergies     There are no active problems to display for this patient.   Past Surgical History:  Procedure Laterality Date  . NO PAST SURGERIES    . TUBAL LIGATION       OB History    Gravida  3   Para  3   Term  3   Preterm  0   AB  0   Living  2     SAB  0   TAB  0   Ectopic  0   Multiple  0   Live Births  2            Home Medications    Prior to Admission medications   Medication Sig Start Date End Date Taking? Authorizing Provider  buPROPion (WELLBUTRIN XL) 300 MG 24 hr tablet Take 1 tablet by mouth daily. 09/24/15   [provider]  cyclobenzaprine (FLEXERIL) 10 MG tablet Take 10 mg by mouth 4 (four) times daily as needed for muscle spasms.    [provider]  doxycycline (DORYX) 100 MG EC tablet Take 100 mg by mouth 2 (two) times daily.    [provider]  fluticasone (FLONASE) 50 MCG/ACT nasal spray Place 2 sprays  into both nostrils daily. 01/27/19   Horton, Barbette Hair, MD  metroNIDAZOLE (FLAGYL) 500 MG tablet Take 500 mg by mouth 2 (two) times daily.    [provider]  naproxen (NAPROSYN) 500 MG tablet Take 1 tablet (500 mg total) by mouth 2 (two) times daily. 01/27/19   Horton, Barbette Hair, MD  omeprazole (PRILOSEC) 20 MG capsule Take 1 capsule by mouth daily. 05/28/17   [provider]  traMADol (ULTRAM) 50 MG tablet Take 1 tablet by mouth every 6 (six) hours as needed. 05/15/17   [provider]    Family History Family History  Problem Relation Age of Onset  . Asthma Father   . Asthma Daughter   . Cancer Maternal Grandmother        lung  . Diabetes Paternal Grandmother     Social History Social History   Tobacco Use  . Smoking status: Current Every Day Smoker    Packs/day: 0.25    Years: 10.00    Pack years: 2.50    Types: Cigarettes    Last attempt to quit:  06/16/2010    Years since quitting: 8.6  . Smokeless tobacco: Never Used  Substance Use Topics  . Alcohol use: No  . Drug use: No     Allergies   Amoxicillin, Aspirin, and Penicillins   Review of Systems Review of Systems  Constitutional: Negative for fever.  HENT: Positive for ear pain. Negative for congestion, ear discharge, rhinorrhea, sinus pain and sore throat.   Respiratory: Positive for cough. Negative for shortness of breath.   Cardiovascular: Negative for chest pain.  Gastrointestinal: Negative for abdominal pain, nausea and vomiting.  All other systems reviewed and are negative.    Physical Exam Updated Vital Signs BP (!) 172/95 (BP Location: Right Arm)   Pulse 89   Temp 99.1 F (37.3 C) (Oral)   Resp 20   Ht 1.626 m (5\' 4" )   Wt 86.6 kg   LMP 01/20/2019 (Approximate)   SpO2 100%   BMI 32.79 kg/m   Physical Exam Vitals signs and nursing note reviewed.  Constitutional:      Appearance: She is well-developed. She is obese. She is not ill-appearing.  HENT:     Head:  Normocephalic and atraumatic.     Comments: No tenderness palpation over the frontal or maxillary sinuses    Right Ear: Tympanic membrane, ear canal and external ear normal.     Left Ear: Tympanic membrane, ear canal and external ear normal.     Ears:     Comments: No external tenderness or pain with manipulation of the pinna    Nose: Nose normal.     Mouth/Throat:     Comments: Posterior oropharynx clear, no erythema or exudate, no trismus Eyes:     Extraocular Movements: Extraocular movements intact.     Pupils: Pupils are equal, round, and reactive to light.  Neck:     Musculoskeletal: Neck supple.  Cardiovascular:     Rate and Rhythm: Normal rate and regular rhythm.     Heart sounds: Normal heart sounds.  Pulmonary:     Effort: Pulmonary effort is normal. No respiratory distress.     Breath sounds: No wheezing.  Abdominal:     General: Bowel sounds are normal.     Palpations: Abdomen is soft.  Lymphadenopathy:     Cervical: No cervical adenopathy.  Skin:    General: Skin is warm and dry.  Neurological:     Mental Status: She is alert and oriented to person, place, and time.  Psychiatric:        Mood and Affect: Mood normal.      ED Treatments / Results  Labs (all labs ordered are listed, but only abnormal results are displayed) Labs Reviewed - No data to display  EKG None  Radiology No results found.  Procedures Procedures (including critical care time)  Medications Ordered in ED Medications  ketorolac (TORADOL) 30 MG/ML injection 30 mg (30 mg Intramuscular Given 01/27/19 0324)     Initial Impression / Assessment and Plan / ED Course  I have reviewed the triage vital signs and the nursing notes.  Pertinent labs & imaging results that were available during my care of the patient were reviewed by me and considered in my medical decision making (see chart for details).        Patient presents with otalgia.  Currently taking a Z-Pak with minimal relief.   She is overall nontoxic and vital signs are notable for blood pressure 172/95.  Her ear exam is normal.  The rest of her exam  is also largely unremarkable.  Etiology is unclear at this time although there is no obvious infection of foreign body.  Patient reports that she cannot take aspirin but she does not know why.  She has not tried anti-inflammatories.  She was given Toradol.  Will recommend Flonase to drain the middle ear and anti-inflammatories such as naproxen for pain.  Follow-up with primary physician if not improving.  After history, exam, and medical workup I feel the patient has been appropriately medically screened and is safe for discharge home. Pertinent diagnoses were discussed with the patient. Patient was given return precautions.   Final Clinical Impressions(s) / ED Diagnoses   Final diagnoses:  Acute otalgia, right    ED Discharge Orders         Ordered    naproxen (NAPROSYN) 500 MG tablet  2 times daily     01/27/19 0344    fluticasone (FLONASE) 50 MCG/ACT nasal spray  Daily     01/27/19 0344           Shon Baton, MD 01/27/19 [phone]

## 2019-05-15 ENCOUNTER — Ambulatory Visit: Payer: Medicaid Other
